# Patient Record
Sex: Female | Born: 2000 | Race: White | Hispanic: No | Marital: Single | Smoking: Never smoker
Health system: Southern US, Community
[De-identification: ages and names within clinical notes are randomized; demographics above are authoritative.]

## PROBLEM LIST (undated history)

## (undated) DIAGNOSIS — J302 Other seasonal allergic rhinitis: Secondary | ICD-10-CM

## (undated) DIAGNOSIS — F419 Anxiety disorder, unspecified: Secondary | ICD-10-CM

## (undated) DIAGNOSIS — F329 Major depressive disorder, single episode, unspecified: Secondary | ICD-10-CM

## (undated) DIAGNOSIS — F909 Attention-deficit hyperactivity disorder, unspecified type: Secondary | ICD-10-CM

## (undated) DIAGNOSIS — F32A Depression, unspecified: Secondary | ICD-10-CM

## (undated) DIAGNOSIS — J45909 Unspecified asthma, uncomplicated: Secondary | ICD-10-CM

## (undated) HISTORY — PX: WISDOM TOOTH EXTRACTION: SHX21

## (undated) HISTORY — DX: Other seasonal allergic rhinitis: J30.2

---
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
## 2022-03-09 DIAGNOSIS — Z20822 Contact with and (suspected) exposure to covid-19: Secondary | ICD-10-CM | POA: Diagnosis not present

## 2022-03-22 ENCOUNTER — Emergency Department
Admission: EM | Admit: 2022-03-22 | Discharge: 2022-03-22 | Disposition: A | Discharge status: Home / Self Care | Payer: Medicaid Other | Attending: Emergency Medicine | Admitting: Emergency Medicine

## 2022-03-22 ENCOUNTER — Emergency Department: Payer: Medicaid Other

## 2022-03-22 ENCOUNTER — Other Ambulatory Visit: Payer: Self-pay

## 2022-03-22 DIAGNOSIS — R000 Tachycardia, unspecified: Secondary | ICD-10-CM | POA: Diagnosis not present

## 2022-03-22 DIAGNOSIS — M549 Dorsalgia, unspecified: Secondary | ICD-10-CM | POA: Insufficient documentation

## 2022-03-22 DIAGNOSIS — R059 Cough, unspecified: Secondary | ICD-10-CM | POA: Diagnosis not present

## 2022-03-22 DIAGNOSIS — J101 Influenza due to other identified influenza virus with other respiratory manifestations: Secondary | ICD-10-CM | POA: Insufficient documentation

## 2022-03-22 DIAGNOSIS — Z1152 Encounter for screening for COVID-19: Secondary | ICD-10-CM | POA: Diagnosis not present

## 2022-03-22 DIAGNOSIS — R509 Fever, unspecified: Secondary | ICD-10-CM | POA: Diagnosis present

## 2022-03-22 LAB — URINALYSIS, ROUTINE W REFLEX MICROSCOPIC
Bilirubin Urine: NEGATIVE
Glucose, UA: NEGATIVE mg/dL
Hgb urine dipstick: NEGATIVE
Ketones, ur: 5 mg/dL — AB
Nitrite: NEGATIVE
Protein, ur: NEGATIVE mg/dL
Specific Gravity, Urine: 1.023 (ref 1.005–1.030)
pH: 6 (ref 5.0–8.0)

## 2022-03-22 LAB — RESP PANEL BY RT-PCR (RSV, FLU A&B, COVID)  RVPGX2
Influenza A by PCR: POSITIVE — AB
Influenza B by PCR: NEGATIVE
Resp Syncytial Virus by PCR: NEGATIVE
SARS Coronavirus 2 by RT PCR: NEGATIVE

## 2022-03-22 LAB — POC URINE PREG, ED: Preg Test, Ur: NEGATIVE

## 2022-03-22 LAB — GROUP A STREP BY PCR: Group A Strep by PCR: NOT DETECTED

## 2022-03-22 MED ORDER — BENZONATATE 100 MG PO CAPS: 100.0000 mg | ORAL_CAPSULE | Freq: Three times a day (TID) | ORAL | 0 refills | Status: AC | PRN

## 2022-03-22 MED ORDER — ACETAMINOPHEN 500 MG PO TABS
1000.0000 mg | ORAL_TABLET | Freq: Once | ORAL | Status: AC
  Administered 2022-03-22: 1000 mg via ORAL
  Filled 2022-03-22: qty 2

## 2022-03-22 MED ORDER — ALBUTEROL SULFATE (2.5 MG/3ML) 0.083% IN NEBU
2.5000 mg | INHALATION_SOLUTION | Freq: Once | RESPIRATORY_TRACT | Status: DC
Start: 2022-03-22 — End: 2022-03-22

## 2022-03-22 MED ORDER — ALBUTEROL SULFATE HFA 108 (90 BASE) MCG/ACT IN AERS: 2.0000 | INHALATION_SPRAY | Freq: Four times a day (QID) | RESPIRATORY_TRACT | 2 refills | Status: DC | PRN

## 2022-03-22 MED ORDER — OSELTAMIVIR PHOSPHATE 75 MG PO CAPS: 75.0000 mg | ORAL_CAPSULE | Freq: Two times a day (BID) | ORAL | 0 refills | Status: AC

## 2022-03-22 NOTE — ED Triage Notes (Signed)
Pt states waking up this morning with a cough and fever and sore throat. Pt states that when she started to cough her back started to hurt and it wont go away. Pt crying in triage.   Pt states taking tyzadine earlier today

## 2022-03-22 NOTE — ED Notes (Addendum)
Dr. Modesto Charon made aware of pt and gave verbal orders, please see orders

## 2022-03-22 NOTE — ED Provider Notes (Signed)
[facility] Provider Note  Patient Contact: 8:07 PM (approximate)   History   Back Pain   HPI  Connie Johnson is a 21 y.o. female presents to the emergency department with fever, back pain, pharyngitis and headache.  No chest pain, chest tightness or shortness of breath.  No vomiting or diarrhea.  No recent travel.      Physical Exam   Triage Vital Signs: ED Triage Vitals  Enc Vitals Group     BP 03/22/22 1813 123/88     Pulse Rate 03/22/22 1813 (!) 109     Resp 03/22/22 1813 (!) 26     Temp 03/22/22 1813 (!) 100.5 F (38.1 C)     Temp src --      SpO2 03/22/22 1813 99 %     Weight 03/22/22 1815 137 lb (62.1 kg)     Height 03/22/22 1815 5\' 3"  (1.6 m)     Head Circumference --      Peak Flow --      Pain Score 03/22/22 1815 10     Pain Loc --      Pain Edu? --      Excl. in GC? --     Most recent vital signs: Vitals:   03/22/22 1813 03/22/22 1828  BP: 123/88   Pulse: (!) 109   Resp: (!) 26 20  Temp: (!) 100.5 F (38.1 C)   SpO2: 99%     Constitutional: Alert and oriented. Patient is lying supine. Eyes: Conjunctivae are normal. PERRL. EOMI. Head: Atraumatic. ENT:      Ears: Tympanic membranes are mildly injected with mild effusion bilaterally.       Nose: No congestion/rhinnorhea.      Mouth/Throat: Mucous membranes are moist. Posterior pharynx is mildly erythematous.  Hematological/Lymphatic/Immunilogical: No cervical lymphadenopathy.  Cardiovascular: Normal rate, regular rhythm. Normal S1 and S2.  Good peripheral circulation. Respiratory: Normal respiratory effort without tachypnea or retractions. Lungs CTAB. Good air entry to the bases with no decreased or absent breath sounds. Gastrointestinal: Bowel sounds 4 quadrants. Soft and nontender to palpation. No guarding or rigidity. No palpable masses. No distention. No CVA tenderness. Musculoskeletal: Full range of motion to all extremities. No gross deformities  appreciated. Neurologic:  Normal speech and language. No gross focal neurologic deficits are appreciated.  Skin:  Skin is warm, dry and intact. No rash noted. Psychiatric: Mood and affect are normal. Speech and behavior are normal. Patient exhibits appropriate insight and judgement.    ED Results / Procedures / Treatments   Labs (all labs ordered are listed, but only abnormal results are displayed) Labs Reviewed  RESP PANEL BY RT-PCR (RSV, FLU A&B, COVID)  RVPGX2 - Abnormal; Notable for the following components:      Result Value   Influenza A by PCR POSITIVE (*)    All other components within normal limits  URINALYSIS, ROUTINE W REFLEX MICROSCOPIC - Abnormal; Notable for the following components:   Color, Urine YELLOW (*)    APPearance HAZY (*)    Ketones, ur 5 (*)    Leukocytes,Ua SMALL (*)    Bacteria, UA RARE (*)    All other components within normal limits  GROUP A STREP BY PCR  POC URINE PREG, ED       PROCEDURES:  Critical Care performed: No  Procedures   MEDICATIONS ORDERED IN ED: Medications  acetaminophen (TYLENOL) tablet 1,000 mg (1,000 mg Oral Given 03/22/22 1827)     IMPRESSION / MDM /  ASSESSMENT AND PLAN / ED COURSE  I reviewed the triage vital signs and the nursing notes.                             Assessment and plan Influenza A 21 year old female presents to the emergency department with flulike symptoms.  Patient had low-grade fever and was mildly tachycardic at triage.  Urine pregnancy test was negative.  Group A strep testing was negative.  Patient tested positive for influenza A.  Urinalysis not.  Medications were recommended at home.  All patient questions were answered.      FINAL CLINICAL IMPRESSION(S) / ED DIAGNOSES   Final diagnoses:  Influenza A     Rx / DC Orders   ED Discharge Orders          Ordered    oseltamivir (TAMIFLU) 75 MG capsule  2 times daily        03/22/22 1935    benzonatate (TESSALON PERLES) 100 MG  capsule  3 times daily PRN        03/22/22 1936    albuterol (VENTOLIN HFA) 108 (90 Base) MCG/ACT inhaler  Every 6 hours PRN        03/22/22 1939             Note:  This document was prepared using Dragon voice recognition software and may include unintentional dictation errors.   Pia Mau [place], PA-C 03/22/22 2010    Concha Se, MD 03/22/22 2122

## 2022-03-24 DIAGNOSIS — R509 Fever, unspecified: Secondary | ICD-10-CM | POA: Diagnosis not present

## 2022-03-24 DIAGNOSIS — J45909 Unspecified asthma, uncomplicated: Secondary | ICD-10-CM | POA: Diagnosis not present

## 2022-03-24 DIAGNOSIS — F909 Attention-deficit hyperactivity disorder, unspecified type: Secondary | ICD-10-CM | POA: Diagnosis not present

## 2022-03-24 DIAGNOSIS — R112 Nausea with vomiting, unspecified: Secondary | ICD-10-CM | POA: Diagnosis not present

## 2022-03-24 DIAGNOSIS — R21 Rash and other nonspecific skin eruption: Secondary | ICD-10-CM | POA: Diagnosis not present

## 2022-03-24 DIAGNOSIS — R0981 Nasal congestion: Secondary | ICD-10-CM | POA: Diagnosis not present

## 2022-03-25 ENCOUNTER — Telehealth: Payer: Self-pay | Admitting: *Deleted

## 2022-03-25 NOTE — Patient Outreach (Signed)
  Care Coordination [facility] Note Transition Care Management Unsuccessful Follow-up Telephone Call  Date of discharge and from where:  03/22/22 from [facility] and 03/24/22 from [facility] ED  Attempts:  1st Attempt  Reason for unsuccessful TCM follow-up call:  Left voice message   Estanislado Emms RN, BSN [place]  [facility] RN Care Coordinator

## 2022-04-22 ENCOUNTER — Ambulatory Visit
Admission: EM | Admit: 2022-04-22 | Discharge: 2022-04-22 | Disposition: A | Discharge status: Home / Self Care | Payer: Medicaid Other | Attending: Urgent Care | Admitting: Urgent Care

## 2022-04-22 DIAGNOSIS — N898 Other specified noninflammatory disorders of vagina: Secondary | ICD-10-CM | POA: Diagnosis not present

## 2022-04-22 LAB — POCT URINALYSIS DIP (MANUAL ENTRY)
Bilirubin, UA: NEGATIVE
Glucose, UA: NEGATIVE mg/dL
Ketones, POC UA: NEGATIVE mg/dL
Nitrite, UA: NEGATIVE
Spec Grav, UA: >=1.03 — AB (ref 1.010–1.025)
Urobilinogen, UA: 0.2 E.U./dL
pH, UA: 6 (ref 5.0–8.0)

## 2022-04-22 NOTE — ED Triage Notes (Signed)
Pt. Presents to UC requesting STD testing. States she sometimes experiences vaginal itching.

## 2022-04-22 NOTE — Discharge Instructions (Signed)
Follow up here or with your primary care provider if your symptoms are worsening or not improving.     

## 2022-04-22 NOTE — ED Provider Notes (Signed)
[name]    CSN: [id number] Arrival date & time: 04/22/22  1150      History   Chief Complaint Chief Complaint  Patient presents with   Vaginal Itching    HPI Connie Johnson is a 22 y.o. female.    Vaginal Itching    Patient presents to urgent care requesting STD testing.  She endorses intermittent vaginal itching.  She denies dysuria.  Denies abdominal pain or flank pain.  Denies vaginal discharge.  Denies fever, chills, body aches.  Past Medical History:  Diagnosis Date   ADHD (attention deficit hyperactivity disorder)    Anxiety    Asthma    Depression    Seasonal allergies     Patient Active Problem List   Diagnosis Date Noted   MDD (major depressive disorder), severe ([place]) 11/14/2017   Suicide attempt by hanging ([place]) 11/14/2017   Cannabis use disorder, mild, abuse 11/14/2017   DMDD (disruptive mood dysregulation disorder) ([place]) 05/31/2017   Severe recurrent major depression without psychotic features ([place]) 05/30/2017   Migraine without aura and without status migrainosus, not intractable 10/16/2015   Episodic tension-type headache, not intractable 10/16/2015   Intermittent explosive disorder 10/16/2015   Medication adverse effect 09/10/2015   Outbursts of anger 09/10/2015   Episodic memory loss 09/10/2015   Bipolar mood disorder ([place]) 05/21/2015   Attention deficit hyperactivity disorder (ADHD) 05/21/2015   GAD (generalized anxiety disorder) 05/21/2015   Adjustment disorder with other symptom 01/24/2014    History reviewed. No pertinent surgical history.  OB History     Gravida  0   Para  0   Term  0   Preterm  0   AB  0   Living         SAB  0   IAB  0   Ectopic  0   Multiple      Live Births               Home Medications    Prior to Admission medications   Medication Sig Start Date End Date Taking? Authorizing Provider  albuterol (VENTOLIN HFA) 108 (90 Base) MCG/ACT inhaler Inhale 2 puffs into the lungs  every 6 (six) hours as needed for wheezing or shortness of breath. 03/22/22   Lannie Fields, PA-C  atomoxetine (STRATTERA) 40 MG capsule Take 1 capsule (40 mg total) by mouth at bedtime. Patient not taking: Reported on 02/18/2020 11/19/17   Ambrose Finland, MD  brompheniramine-pseudoephedrine-DM 30-2-10 MG/5ML syrup Take 5 mLs by mouth 4 (four) times daily as needed. 08/15/21   Johnn Hai, PA-C  cloNIDine (CATAPRES) 0.1 MG tablet Take 0.1 mg by mouth at bedtime. Patient not taking: Reported on 12/13/2019 11/07/19   [provider]  diphenhydrAMINE (BENADRYL) 25 mg capsule Take 1 capsule (25 mg total) by mouth at bedtime as needed for itching. 11/18/17   Ambrose Finland, MD  FLUoxetine (PROZAC) 10 MG capsule Take 10 mg by mouth every evening. Patient not taking: Reported on 02/18/2020    [provider]  hydrOXYzine (ATARAX/VISTARIL) 25 MG tablet Take 25 mg by mouth 3 (three) times daily as needed. Patient not taking: Reported on 02/18/2020    [provider]  lamoTRIgine (LAMICTAL) 100 MG tablet Take 100 mg by mouth daily. Patient not taking: Reported on 02/18/2020 11/07/19   [provider]  metroNIDAZOLE (FLAGYL) 500 MG tablet Take 1 tablet (500 mg total) by mouth 2 (two) times daily. 12/18/21   Johnn Hai, PA-C  Family History Family History  Problem Relation Age of Onset   Bipolar disorder Mother    Depression Mother    ADD / ADHD Brother    Bipolar disorder Maternal Grandmother    Depression Maternal Grandmother     Social History Social History   Tobacco Use   Smoking status: Never   Smokeless tobacco: Never  Vaping Use   Vaping Use: Never used  Substance Use Topics   Alcohol use: No    Alcohol/week: 0.0 standard drinks of alcohol   Drug use: Not Currently    Types: Marijuana    Comment: says she is around it.     Allergies   Dexedrine [dextroamphetamine sulfate er]   Review of Systems Review of  Systems   Physical Exam Triage Vital Signs ED Triage Vitals  Enc Vitals Group     BP 04/22/22 1200 (!) 123/90     Pulse Rate 04/22/22 1200 (!) 123     Resp 04/22/22 1200 17     Temp 04/22/22 1200 98.1 F (36.7 C)     Temp src --      SpO2 04/22/22 1200 98 %     Weight --      Height --      Head Circumference --      Peak Flow --      Pain Score 04/22/22 1201 0     Pain Loc --      Pain Edu? --      Excl. in [place]? --    No data found.  Updated Vital Signs BP (!) 123/90   Pulse (!) 123   Temp 98.1 F (36.7 C)   Resp 17   LMP  (LMP Unknown)   SpO2 98%   Visual Acuity Right Eye Distance:   Left Eye Distance:   Bilateral Distance:    Right Eye Near:   Left Eye Near:    Bilateral Near:     Physical Exam Vitals reviewed.  Constitutional:      Appearance: Normal appearance.  Skin:    General: Skin is warm and dry.  Neurological:     General: No focal deficit present.     Mental Status: She is alert.  Psychiatric:        Mood and Affect: Mood normal.        Behavior: Behavior normal.      UC Treatments / Results  Labs (all labs ordered are listed, but only abnormal results are displayed) Labs Reviewed  POCT URINALYSIS DIP (MANUAL ENTRY)  CERVICOVAGINAL ANCILLARY ONLY    EKG   Radiology No results found.  Procedures Procedures (including critical care time)  Medications Ordered in UC Medications - No data to display  Initial Impression / Assessment and Plan / UC Course  I have reviewed the triage vital signs and the nursing notes.  Pertinent labs & imaging results that were available during my care of the patient were reviewed by me and considered in my medical decision making (see chart for details).   Vaginal swab was obtained and informed patient that she would be contacted if she needed treatment for any positive results.   Final Clinical Impressions(s) / UC Diagnoses   Final diagnoses:  Vaginal itching   Discharge Instructions    None    ED Prescriptions   None    PDMP not reviewed this encounter.   Rose Phi, [place] 04/22/22 1221

## 2022-04-23 LAB — CERVICOVAGINAL ANCILLARY ONLY
Bacterial Vaginitis (gardnerella): NEGATIVE
Candida Glabrata: NEGATIVE
Candida Vaginitis: POSITIVE — AB
Chlamydia: NEGATIVE
Comment: NEGATIVE
Comment: NEGATIVE
Comment: NEGATIVE
Comment: NEGATIVE
Comment: NEGATIVE
Comment: NORMAL
Neisseria Gonorrhea: NEGATIVE
Trichomonas: NEGATIVE

## 2022-04-24 ENCOUNTER — Telehealth (HOSPITAL_COMMUNITY): Payer: Self-pay | Admitting: Emergency Medicine

## 2022-04-24 MED ORDER — FLUCONAZOLE 150 MG PO TABS: 150.0000 mg | ORAL_TABLET | Freq: Once | ORAL | 0 refills | Status: AC

## 2022-05-17 ENCOUNTER — Ambulatory Visit
Admission: EM | Admit: 2022-05-17 | Discharge: 2022-05-17 | Disposition: A | Discharge status: Home / Self Care | Payer: Medicaid Other | Attending: Emergency Medicine | Admitting: Emergency Medicine

## 2022-05-17 DIAGNOSIS — J45901 Unspecified asthma with (acute) exacerbation: Secondary | ICD-10-CM | POA: Diagnosis not present

## 2022-05-17 DIAGNOSIS — J0100 Acute maxillary sinusitis, unspecified: Secondary | ICD-10-CM

## 2022-05-17 DIAGNOSIS — Z760 Encounter for issue of repeat prescription: Secondary | ICD-10-CM

## 2022-05-17 MED ORDER — VENTOLIN HFA 108 (90 BASE) MCG/ACT IN AERS: 1.0000 | INHALATION_SPRAY | Freq: Four times a day (QID) | RESPIRATORY_TRACT | 0 refills | Status: DC | PRN

## 2022-05-17 MED ORDER — PREDNISONE 10 MG PO TABS: 40.0000 mg | ORAL_TABLET | Freq: Every day | ORAL | 0 refills | Status: AC

## 2022-05-17 MED ORDER — AMOXICILLIN 875 MG PO TABS: 875.0000 mg | ORAL_TABLET | Freq: Two times a day (BID) | ORAL | 0 refills | Status: AC

## 2022-05-17 NOTE — Discharge Instructions (Addendum)
Use the albuterol inhaler as directed.  Take the prednisone and amoxicillin as directed.  Follow up with your primary care provider.

## 2022-05-17 NOTE — ED Provider Notes (Signed)
[facility]    CSN: [id number] Arrival date & time: 05/17/22  1413      History   Chief Complaint Chief Complaint  Patient presents with   Nasal Congestion    HPI Connie Johnson is a 22 y.o. female.  Patient presents with 1 week history of congestion, runny nose, postnasal drip, cough, wheezing at night, sore throat in the mornings.  Treating with albuterol inhaler but needs a refill.  Also treating with Nyquil.  She denies fever, chills, shortness of breath, vomiting, diarrhea, or other symptoms.  Her medical history includes asthma.    The history is provided by the patient and medical records.    Past Medical History:  Diagnosis Date   ADHD (attention deficit hyperactivity disorder)    Anxiety    Asthma    Depression    Seasonal allergies     Patient Active Problem List   Diagnosis Date Noted   MDD (major depressive disorder), severe ([place]) 11/14/2017   Suicide attempt by hanging ([place]) 11/14/2017   Cannabis use disorder, mild, abuse 11/14/2017   DMDD (disruptive mood dysregulation disorder) ([place]) 05/31/2017   Severe recurrent major depression without psychotic features ([place]) 05/30/2017   Migraine without aura and without status migrainosus, not intractable 10/16/2015   Episodic tension-type headache, not intractable 10/16/2015   Intermittent explosive disorder 10/16/2015   Medication adverse effect 09/10/2015   Outbursts of anger 09/10/2015   Episodic memory loss 09/10/2015   Bipolar mood disorder ([place]) 05/21/2015   Attention deficit hyperactivity disorder (ADHD) 05/21/2015   GAD (generalized anxiety disorder) 05/21/2015   Adjustment disorder with other symptom 01/24/2014    Past Surgical History:  Procedure Laterality Date   WISDOM TOOTH EXTRACTION      OB History     Gravida  0   Para  0   Term  0   Preterm  0   AB  0   Living         SAB  0   IAB  0   Ectopic  0   Multiple      Live Births               Home  Medications    Prior to Admission medications   Medication Sig Start Date End Date Taking? Authorizing Provider  albuterol (VENTOLIN HFA) 108 (90 Base) MCG/ACT inhaler Inhale 1-2 puffs into the lungs every 6 (six) hours as needed for wheezing or shortness of breath. 05/17/22  Yes Sharion Balloon, NP  amoxicillin (AMOXIL) 875 MG tablet Take 1 tablet (875 mg total) by mouth 2 (two) times daily for 7 days. 05/17/22 05/24/22 Yes Sharion Balloon, NP  atomoxetine (STRATTERA) 40 MG capsule Take 1 capsule (40 mg total) by mouth at bedtime. Patient not taking: Reported on 02/18/2020 11/19/17   Ambrose Finland, MD  brompheniramine-pseudoephedrine-DM 30-2-10 MG/5ML syrup Take 5 mLs by mouth 4 (four) times daily as needed. Patient not taking: Reported on 05/17/2022 08/15/21   Johnn Hai, PA-C  cloNIDine (CATAPRES) 0.1 MG tablet Take 0.1 mg by mouth at bedtime. Patient not taking: Reported on 12/13/2019 11/07/19   [provider]  diphenhydrAMINE (BENADRYL) 25 mg capsule Take 1 capsule (25 mg total) by mouth at bedtime as needed for itching. Patient not taking: Reported on 05/17/2022 11/18/17   Ambrose Finland, MD  FLUoxetine (PROZAC) 10 MG capsule Take 10 mg by mouth every evening. Patient not taking: Reported on 02/18/2020    [provider]  hydrOXYzine (ATARAX/VISTARIL) 25 MG tablet Take 25 mg by mouth 3 (three) times daily as needed. Patient not taking: Reported on 02/18/2020    [provider]  lamoTRIgine (LAMICTAL) 100 MG tablet Take 100 mg by mouth daily. Patient not taking: Reported on 02/18/2020 11/07/19   [provider]  metroNIDAZOLE (FLAGYL) 500 MG tablet Take 1 tablet (500 mg total) by mouth 2 (two) times daily. Patient not taking: Reported on 05/17/2022 12/18/21   Johnn Hai, PA-C  predniSONE (DELTASONE) 10 MG tablet Take 4 tablets (40 mg total) by mouth daily for 5 days. 05/17/22 05/22/22 Yes Sharion Balloon, NP    Family History Family  History  Problem Relation Age of Onset   Bipolar disorder Mother    Depression Mother    ADD / ADHD Brother    Bipolar disorder Maternal Grandmother    Depression Maternal Grandmother     Social History Social History   Tobacco Use   Smoking status: Never   Smokeless tobacco: Never  Vaping Use   Vaping Use: Never used  Substance Use Topics   Alcohol use: No    Alcohol/week: 0.0 standard drinks of alcohol   Drug use: Not Currently    Types: Marijuana    Comment: says she is around it.     Allergies   Dexedrine [dextroamphetamine sulfate er]   Review of Systems Review of Systems  Constitutional:  Negative for chills and fever.  HENT:  Positive for congestion, postnasal drip, rhinorrhea and sore throat. Negative for ear pain.   Respiratory:  Positive for cough and wheezing. Negative for shortness of breath.   Cardiovascular:  Negative for chest pain and palpitations.  Gastrointestinal:  Negative for diarrhea and vomiting.  Skin:  Negative for color change and rash.  All other systems reviewed and are negative.    Physical Exam Triage Vital Signs ED Triage Vitals  Enc Vitals Group     BP 05/17/22 1542 114/75     Pulse Rate 05/17/22 1542 62     Resp 05/17/22 1542 18     Temp 05/17/22 1542 98.5 F (36.9 C)     Temp src --      SpO2 05/17/22 1542 98 %     Weight --      Height --      Head Circumference --      Peak Flow --      Pain Score 05/17/22 1545 0     Pain Loc --      Pain Edu? --      Excl. in [place]? --    No data found.  Updated Vital Signs BP 114/75   Pulse 62   Temp 98.5 F (36.9 C)   Resp 18   LMP 05/10/2022   SpO2 98%   Visual Acuity Right Eye Distance:   Left Eye Distance:   Bilateral Distance:    Right Eye Near:   Left Eye Near:    Bilateral Near:     Physical Exam Vitals and nursing note reviewed.  Constitutional:      General: She is not in acute distress.    Appearance: Normal appearance. She is well-developed. She is not  ill-appearing.  HENT:     Right Ear: Tympanic membrane normal.     Left Ear: Tympanic membrane normal.     Nose: Congestion and rhinorrhea present.     Mouth/Throat:     Mouth: Mucous membranes are moist.     Pharynx: Oropharynx is  clear.  Cardiovascular:     Rate and Rhythm: Normal rate and regular rhythm.     Heart sounds: Normal heart sounds.  Pulmonary:     Effort: Pulmonary effort is normal. No respiratory distress.     Breath sounds: Normal breath sounds. No wheezing.  Musculoskeletal:     Cervical back: Neck supple.  Skin:    General: Skin is warm and dry.  Neurological:     Mental Status: She is alert.  Psychiatric:        Mood and Affect: Mood normal.        Behavior: Behavior normal.      UC Treatments / Results  Labs (all labs ordered are listed, but only abnormal results are displayed) Labs Reviewed - No data to display  EKG   Radiology No results found.  Procedures Procedures (including critical care time)  Medications Ordered in UC Medications - No data to display  Initial Impression / Assessment and Plan / UC Course  I have reviewed the triage vital signs and the nursing notes.  Pertinent labs & imaging results that were available during my care of the patient were reviewed by me and considered in my medical decision making (see chart for details).    Asthma exacerbation, acute sinusitis, medication refill (albuterol inhaler).   Treating today with refill of albuterol inhaler, prednisone, and amoxicillin.  Education provided on asthma and sinusitis.  Instructed patient to follow up with her PCP.  She agrees to plan of care.    Final Clinical Impressions(s) / UC Diagnoses   Final diagnoses:  Asthma with acute exacerbation, unspecified asthma severity, unspecified whether persistent  Acute non-recurrent maxillary sinusitis  Encounter for medication refill     Discharge Instructions      Use the albuterol inhaler as directed.  Take the  prednisone and amoxicillin as directed.  Follow up with your primary care provider.        ED Prescriptions     Medication Sig Dispense Auth. Provider   albuterol (VENTOLIN HFA) 108 (90 Base) MCG/ACT inhaler Inhale 1-2 puffs into the lungs every 6 (six) hours as needed for wheezing or shortness of breath. 54 g Sharion Balloon, NP   predniSONE (DELTASONE) 10 MG tablet Take 4 tablets (40 mg total) by mouth daily for 5 days. 20 tablet Sharion Balloon, NP   amoxicillin (AMOXIL) 875 MG tablet Take 1 tablet (875 mg total) by mouth 2 (two) times daily for 7 days. 14 tablet Sharion Balloon, NP      PDMP not reviewed this encounter.   Sharion Balloon, NP 05/17/22 1626

## 2022-05-17 NOTE — ED Triage Notes (Addendum)
Patient to Urgent Care with complaints of URI symptoms- nasal congestion w/ discolored drainage/ cough/ sore throat at night. Denies any known fevers.   Symptoms started one week ago. Taking nyquil.   Using albuterol inhaler- requests a refill.

## 2022-09-16 ENCOUNTER — Ambulatory Visit
Admission: EM | Admit: 2022-09-16 | Discharge: 2022-09-16 | Disposition: A | Discharge status: Home / Self Care | Payer: Medicaid Other | Attending: Emergency Medicine | Admitting: Emergency Medicine

## 2022-09-16 DIAGNOSIS — N898 Other specified noninflammatory disorders of vagina: Secondary | ICD-10-CM | POA: Insufficient documentation

## 2022-09-16 DIAGNOSIS — Z113 Encounter for screening for infections with a predominantly sexual mode of transmission: Secondary | ICD-10-CM | POA: Insufficient documentation

## 2022-09-16 DIAGNOSIS — Z760 Encounter for issue of repeat prescription: Secondary | ICD-10-CM | POA: Diagnosis not present

## 2022-09-16 LAB — POCT URINE PREGNANCY: Preg Test, Ur: NEGATIVE

## 2022-09-16 MED ORDER — VENTOLIN HFA 108 (90 BASE) MCG/ACT IN AERS: 1.0000 | INHALATION_SPRAY | RESPIRATORY_TRACT | 0 refills | Status: DC | PRN

## 2022-09-16 NOTE — ED Triage Notes (Addendum)
Patient to Urgent Care with complaints of malodorous vaginal discharge. Unsure of exactly how long her symptoms have been occurring. Denies any urinary symptoms.   Denies any known exposure to STD's but does request STD testing.   Also requests albuterol inhaler be refilled.

## 2022-09-16 NOTE — Discharge Instructions (Addendum)
Your vaginal tests are pending.  If your test results are positive, we will call you.  You and your sexual partner(s) may require treatment at that time.  Do not have sexual activity for at least 7 days.    A refill of your albuterol inhaler was sent to your pharmacy.  Follow up with your primary care provider for additional refills.

## 2022-09-16 NOTE — ED Provider Notes (Signed)
Connie Johnson    CSN: [id number] Arrival date & time: 09/16/22  0957      History   Chief Complaint Chief Complaint  Patient presents with   SEXUALLY TRANSMITTED DISEASE    HPI Connie Johnson is a 22 y.o. female.  Patient presents with vaginal odor x 1 week.  She requests STD testing.  She is sexually active.  She denies vaginal discharge, fever, rash, abdominal pain, dysuria, hematuria, pelvic pain, flank pain, or other symptoms.  Patient also presents with request for her albuterol inhaler to be refilled.  She reports history of asthma and allergies.  She denies cough, wheezing, shortness of breath.  She recently moved and lost her inhaler.    The history is provided by the patient and medical records.    Past Medical History:  Diagnosis Date   ADHD (attention deficit hyperactivity disorder)    Anxiety    Asthma    Depression    Seasonal allergies     Patient Active Problem List   Diagnosis Date Noted   MDD (major depressive disorder), severe ([facility]) 11/14/2017   Suicide attempt by hanging ([facility]) 11/14/2017   Cannabis use disorder, mild, abuse 11/14/2017   DMDD (disruptive mood dysregulation disorder) ([facility]) 05/31/2017   Severe recurrent major depression without psychotic features ([facility]) 05/30/2017   Migraine without aura and without status migrainosus, not intractable 10/16/2015   Episodic tension-type headache, not intractable 10/16/2015   Intermittent explosive disorder 10/16/2015   Medication adverse effect 09/10/2015   Outbursts of anger 09/10/2015   Episodic memory loss 09/10/2015   Bipolar mood disorder ([facility]) 05/21/2015   Attention deficit hyperactivity disorder (ADHD) 05/21/2015   GAD (generalized anxiety disorder) 05/21/2015   Adjustment disorder with other symptom 01/24/2014    Past Surgical History:  Procedure Laterality Date   WISDOM TOOTH EXTRACTION      OB History     Gravida  0   Para  0   Term  0   Preterm  0   AB  0   Living          SAB  0   IAB  0   Ectopic  0   Multiple      Live Births               Home Medications    Prior to Admission medications   Medication Sig Start Date End Date Taking? Authorizing Provider  albuterol (VENTOLIN HFA) 108 (90 Base) MCG/ACT inhaler Inhale 1-2 puffs into the lungs every 4 (four) hours as needed for wheezing or shortness of breath. 09/16/22  Yes Mickie Bail, NP  atomoxetine (STRATTERA) 40 MG capsule Take 1 capsule (40 mg total) by mouth at bedtime. Patient not taking: Reported on 02/18/2020 11/19/17   Leata Mouse, MD  brompheniramine-pseudoephedrine-DM 30-2-10 MG/5ML syrup Take 5 mLs by mouth 4 (four) times daily as needed. Patient not taking: Reported on 05/17/2022 08/15/21   [name], PA-C  cloNIDine (CATAPRES) 0.1 MG tablet Take 0.1 mg by mouth at bedtime. Patient not taking: Reported on 12/13/2019 11/07/19   [provider]  diphenhydrAMINE (BENADRYL) 25 mg capsule Take 1 capsule (25 mg total) by mouth at bedtime as needed for itching. Patient not taking: Reported on 05/17/2022 11/18/17   Leata Mouse, MD  FLUoxetine (PROZAC) 10 MG capsule Take 10 mg by mouth every evening. Patient not taking: Reported on 02/18/2020    [provider]  hydrOXYzine (ATARAX/VISTARIL) 25 MG tablet Take 25 mg  by mouth 3 (three) times daily as needed. Patient not taking: Reported on 02/18/2020    [provider]  lamoTRIgine (LAMICTAL) 100 MG tablet Take 100 mg by mouth daily. Patient not taking: Reported on 02/18/2020 11/07/19   [provider]  metroNIDAZOLE (FLAGYL) 500 MG tablet Take 1 tablet (500 mg total) by mouth 2 (two) times daily. Patient not taking: Reported on 05/17/2022 12/18/21   [name], PA-C    Family History Family History  Problem Relation Age of Onset   Bipolar disorder Mother    Depression Mother    ADD / ADHD Brother    Bipolar disorder Maternal Grandmother    Depression Maternal  Grandmother     Social History Social History   Tobacco Use   Smoking status: Never   Smokeless tobacco: Never  Vaping Use   Vaping Use: Never used  Substance Use Topics   Alcohol use: No    Alcohol/week: 0.0 standard drinks of alcohol   Drug use: Not Currently    Types: Marijuana    Comment: says she is around it.     Allergies   Dexedrine [dextroamphetamine sulfate er]   Review of Systems Review of Systems  Constitutional:  Negative for chills and fever.  Respiratory:  Negative for cough and shortness of breath.   Gastrointestinal:  Negative for abdominal pain, constipation, diarrhea, nausea and vomiting.  Genitourinary:  Negative for dysuria, flank pain, frequency, hematuria, pelvic pain and vaginal discharge.  Skin:  Negative for rash.     Physical Exam Triage Vital Signs ED Triage Vitals  Enc Vitals Group     BP      Pulse      Resp      Temp      Temp src      SpO2      Weight      Height      Head Circumference      Peak Flow      Pain Score      Pain Loc      Pain Edu?      Excl. in [place]?    No data found.  Updated Vital Signs BP 110/75   Pulse 82   Temp 98.3 F (36.8 C)   Resp 18   SpO2 98%   Visual Acuity Right Eye Distance:   Left Eye Distance:   Bilateral Distance:    Right Eye Near:   Left Eye Near:    Bilateral Near:     Physical Exam Vitals and nursing note reviewed.  Constitutional:      General: She is not in acute distress.    Appearance: Normal appearance. She is well-developed. She is not ill-appearing.  HENT:     Right Ear: Tympanic membrane normal.     Left Ear: Tympanic membrane normal.     Nose: Nose normal.     Mouth/Throat:     Mouth: Mucous membranes are moist.     Pharynx: Oropharynx is clear.  Cardiovascular:     Rate and Rhythm: Normal rate and regular rhythm.     Heart sounds: Normal heart sounds.  Pulmonary:     Effort: Pulmonary effort is normal. No respiratory distress.     Breath sounds: Normal  breath sounds. No wheezing.  Abdominal:     General: Bowel sounds are normal.     Palpations: Abdomen is soft.     Tenderness: There is no abdominal tenderness. There is no right CVA tenderness,  left CVA tenderness, guarding or rebound.  Musculoskeletal:     Cervical back: Neck supple.  Skin:    General: Skin is warm and dry.  Neurological:     Mental Status: She is alert.  Psychiatric:        Mood and Affect: Mood normal.        Behavior: Behavior normal.      UC Treatments / Results  Labs (all labs ordered are listed, but only abnormal results are displayed) Labs Reviewed  POCT URINE PREGNANCY  CERVICOVAGINAL ANCILLARY ONLY    EKG   Radiology No results found.  Procedures Procedures (including critical care time)  Medications Ordered in UC Medications - No data to display  Initial Impression / Assessment and Plan / UC Course  I have reviewed the triage vital signs and the nursing notes.  Pertinent labs & imaging results that were available during my care of the patient were reviewed by me and considered in my medical decision making (see chart for details).    STD screening, vaginal odor, encounter for medication refill.  Patient denies symptoms other than vaginal odor.  She is concerned for STDs and requests testing.  She obtained vaginal self swab for testing.  Discussed that we will call if test results are positive.  Discussed that she may require treatment at that time.  Discussed that sexual partner(s) may also require treatment.  Instructed patient to abstain from sexual activity for at least 7 days.  Instructed her to follow-up with her PCP or gynecologist if her symptoms are not improving.  Patient also requests a refill on her albuterol inhaler which is sent to her pharmacy today.  She lost her inhaler in a recent move.  Instructed her to follow-up with her PCP for additional refills.  Patient agrees to plan of care.   Final Clinical Impressions(s) / UC  Diagnoses   Final diagnoses:  Screening for STD (sexually transmitted disease)  Vaginal odor  Encounter for medication refill     Discharge Instructions      Your vaginal tests are pending.  If your test results are positive, we will call you.  You and your sexual partner(s) may require treatment at that time.  Do not have sexual activity for at least 7 days.    A refill of your albuterol inhaler was sent to your pharmacy.  Follow up with your primary care provider for additional refills.          ED Prescriptions     Medication Sig Dispense Auth. Provider   albuterol (VENTOLIN HFA) 108 (90 Base) MCG/ACT inhaler Inhale 1-2 puffs into the lungs every 4 (four) hours as needed for wheezing or shortness of breath. 54 g Mickie Bail, NP      PDMP not reviewed this encounter.   Mickie Bail, NP 09/16/22 1024

## 2022-09-17 ENCOUNTER — Telehealth (HOSPITAL_COMMUNITY): Payer: Self-pay | Admitting: Emergency Medicine

## 2022-09-17 LAB — CERVICOVAGINAL ANCILLARY ONLY
Bacterial Vaginitis (gardnerella): POSITIVE — AB
Candida Glabrata: NEGATIVE
Candida Vaginitis: NEGATIVE
Chlamydia: NEGATIVE
Comment: NEGATIVE
Comment: NEGATIVE
Comment: NEGATIVE
Comment: NEGATIVE
Comment: NEGATIVE
Comment: NORMAL
Neisseria Gonorrhea: NEGATIVE
Trichomonas: NEGATIVE

## 2022-09-17 MED ORDER — METRONIDAZOLE 500 MG PO TABS: 500.0000 mg | ORAL_TABLET | Freq: Two times a day (BID) | ORAL | 0 refills | Status: DC

## 2022-11-02 ENCOUNTER — Ambulatory Visit
Admission: EM | Admit: 2022-11-02 | Discharge: 2022-11-02 | Disposition: A | Discharge status: Home / Self Care | Payer: Medicaid Other | Attending: Family Medicine | Admitting: Family Medicine

## 2022-11-02 DIAGNOSIS — Z1152 Encounter for screening for COVID-19: Secondary | ICD-10-CM | POA: Diagnosis not present

## 2022-11-02 DIAGNOSIS — Z113 Encounter for screening for infections with a predominantly sexual mode of transmission: Secondary | ICD-10-CM

## 2022-11-02 DIAGNOSIS — N760 Acute vaginitis: Secondary | ICD-10-CM

## 2022-11-02 LAB — POCT URINE PREGNANCY: Preg Test, Ur: NEGATIVE

## 2022-11-02 LAB — POCT RAPID STREP A (OFFICE): Rapid Strep A Screen: NEGATIVE

## 2022-11-02 LAB — SARS CORONAVIRUS 2 (TAT 6-24 HRS): SARS Coronavirus 2: NEGATIVE

## 2022-11-02 MED ORDER — METRONIDAZOLE 500 MG PO TABS
500.0000 mg | ORAL_TABLET | Freq: Two times a day (BID) | ORAL | 0 refills | Status: DC
Start: 2022-11-02 — End: 2023-01-18

## 2022-11-02 MED ORDER — FLUCONAZOLE 150 MG PO TABS: 150.0000 mg | ORAL_TABLET | ORAL | 0 refills | Status: DC | PRN

## 2022-11-02 MED ORDER — ACETAMINOPHEN 325 MG PO TABS
650.0000 mg | ORAL_TABLET | Freq: Once | ORAL | Status: AC
  Administered 2022-11-02: 650 mg via ORAL

## 2022-11-02 NOTE — ED Triage Notes (Signed)
Patient to Urgent Care with complaints of sore throat/ productive cough/ generalized body aches/ chills. Possible fever this morning. Symptoms started yesterday.   Also requests STD testing. Denies known exposure. Reports odor/ vaginal discharge. Concern about BV.

## 2022-11-02 NOTE — ED Provider Notes (Signed)
[name]    CSN: [id number] Arrival date & time: 11/02/22  0946      History   Chief Complaint Chief Complaint  Patient presents with   Generalized Body Aches   SEXUALLY TRANSMITTED DISEASE    HPI Connie Johnson is a 22 y.o. female.   HPI Patient here today with complaints of fever,sore throat, cough, and generalized body aches x 1 day. Febrile on arrival today.  No known sick contacts. She has not taken may medication for symptoms.   STD Testing Concern for possible BV. Endorses vaginal odor and discharge. History of BV and Yeast. No known STD exposure. No LMP recorded. Patient has had an implant.   Past Medical History:  Diagnosis Date   ADHD (attention deficit hyperactivity disorder)    Anxiety    Asthma    Depression    Seasonal allergies     Patient Active Problem List   Diagnosis Date Noted   MDD (major depressive disorder), severe (HCC) 11/14/2017   Suicide attempt by hanging (HCC) 11/14/2017   Cannabis use disorder, mild, abuse 11/14/2017   DMDD (disruptive mood dysregulation disorder) (HCC) 05/31/2017   Severe recurrent major depression without psychotic features (HCC) 05/30/2017   Migraine without aura and without status migrainosus, not intractable 10/16/2015   Episodic tension-type headache, not intractable 10/16/2015   Intermittent explosive disorder 10/16/2015   Medication adverse effect 09/10/2015   Outbursts of anger 09/10/2015   Episodic memory loss 09/10/2015   Bipolar mood disorder (HCC) 05/21/2015   Attention deficit hyperactivity disorder (ADHD) 05/21/2015   GAD (generalized anxiety disorder) 05/21/2015   Adjustment disorder with other symptom 01/24/2014    Past Surgical History:  Procedure Laterality Date   WISDOM TOOTH EXTRACTION      OB History     Gravida  0   Para  0   Term  0   Preterm  0   AB  0   Living         SAB  0   IAB  0   Ectopic  0   Multiple      Live Births                Home Medications    Prior to Admission medications   Medication Sig Start Date End Date Taking? Authorizing Provider  fluconazole (DIFLUCAN) 150 MG tablet Take 1 tablet (150 mg total) by mouth every three (3) days as needed. Repeat if needed 11/02/22  Yes Bing Neighbors, NP  metroNIDAZOLE (FLAGYL) 500 MG tablet Take 1 tablet (500 mg total) by mouth 2 (two) times daily with a meal. DO NOT CONSUME ALCOHOL WHILE TAKING THIS MEDICATION. 11/02/22  Yes Bing Neighbors, NP  albuterol (VENTOLIN HFA) 108 (90 Base) MCG/ACT inhaler Inhale 1-2 puffs into the lungs every 4 (four) hours as needed for wheezing or shortness of breath. 09/16/22   Mickie Bail, NP  atomoxetine (STRATTERA) 40 MG capsule Take 1 capsule (40 mg total) by mouth at bedtime. Patient not taking: Reported on 02/18/2020 11/19/17   Leata Mouse, MD  brompheniramine-pseudoephedrine-DM 30-2-10 MG/5ML syrup Take 5 mLs by mouth 4 (four) times daily as needed. Patient not taking: Reported on 05/17/2022 08/15/21   [name], PA-C  cloNIDine (CATAPRES) 0.1 MG tablet Take 0.1 mg by mouth at bedtime. Patient not taking: Reported on 12/13/2019 11/07/19   [provider]  diphenhydrAMINE (BENADRYL) 25 mg capsule Take 1 capsule (25 mg total) by mouth at bedtime as  needed for itching. Patient not taking: Reported on 05/17/2022 11/18/17   Leata Mouse, MD  FLUoxetine (PROZAC) 10 MG capsule Take 10 mg by mouth every evening. Patient not taking: Reported on 02/18/2020    [provider]  hydrOXYzine (ATARAX/VISTARIL) 25 MG tablet Take 25 mg by mouth 3 (three) times daily as needed. Patient not taking: Reported on 02/18/2020    [provider]  lamoTRIgine (LAMICTAL) 100 MG tablet Take 100 mg by mouth daily. Patient not taking: Reported on 02/18/2020 11/07/19   [provider]    Family History Family History  Problem Relation Age of Onset   Bipolar disorder Mother    Depression  Mother    ADD / ADHD Brother    Bipolar disorder Maternal Grandmother    Depression Maternal Grandmother     Social History Social History   Tobacco Use   Smoking status: Never   Smokeless tobacco: Never  Vaping Use   Vaping status: Never Used  Substance Use Topics   Alcohol use: No    Alcohol/week: 0.0 standard drinks of alcohol   Drug use: Not Currently    Types: Marijuana    Comment: says she is around it.     Allergies   Dexedrine [dextroamphetamine sulfate er]   Review of Systems Review of Systems Pertinent negatives listed in HPI   Physical Exam Triage Vital Signs ED Triage Vitals [11/02/22 1002]  Encounter Vitals Group     BP 129/83     Systolic BP Percentile      Diastolic BP Percentile      Pulse Rate (!) 113     Resp 18     Temp 100.3 F (37.9 C)     Temp src      SpO2 96 %     Weight      Height      Head Circumference      Peak Flow      Pain Score      Pain Loc      Pain Education      Exclude from Growth Chart    No data found.  Updated Vital Signs BP 129/83   Pulse (!) 113   Temp 100.3 F (37.9 C)   Resp 18   SpO2 96%   Visual Acuity Right Eye Distance:   Left Eye Distance:   Bilateral Distance:    Right Eye Near:   Left Eye Near:    Bilateral Near:     Physical Exam Vitals and nursing note reviewed.  Constitutional:      Appearance: She is ill-appearing.  HENT:     Head: Normocephalic and atraumatic.     Nose: Congestion present.     Mouth/Throat:     Pharynx: Posterior oropharyngeal erythema present. No oropharyngeal exudate.  Eyes:     Extraocular Movements: Extraocular movements intact.     Conjunctiva/sclera: Conjunctivae normal.     Pupils: Pupils are equal, round, and reactive to light.  Cardiovascular:     Rate and Rhythm: Regular rhythm. Tachycardia present.  Pulmonary:     Effort: Pulmonary effort is normal.     Breath sounds: Normal breath sounds.  Skin:    General: Skin is warm and dry.   Neurological:     General: No focal deficit present.     Mental Status: She is alert.      UC Treatments / Results  Labs (all labs ordered are listed, but only abnormal results are displayed) Labs Reviewed  SARS CORONAVIRUS 2 (TAT 6-24 HRS)  POCT RAPID STREP A (OFFICE)  POCT URINE PREGNANCY  CERVICOVAGINAL ANCILLARY ONLY    EKG   Radiology No results found.  Procedures Procedures (including critical care time)  Medications Ordered in UC Medications  acetaminophen (TYLENOL) tablet 650 mg (has no administration in time range)    Initial Impression / Assessment and Plan / UC Course  I have reviewed the triage vital signs and the nursing notes.  Pertinent labs & imaging results that were available during my care of the patient were reviewed by me and considered in my medical decision making (see chart for details).    Rapid Strep negative. COVID test pending. Symptom management warranted. Treating for vaginitis while awaiting vaginal cytology, metronidazole and Diflucan.  Urine hCG negative patient encouraged to continue Tylenol and ibuprofen as needed for fever and bodyaches.  Work note provided.  Precautions given. Final Clinical Impressions(s) / UC Diagnoses   Final diagnoses:  Screen for STD (sexually transmitted disease)  Vaginitis and vulvovaginitis  Encounter for screening for COVID-19     Discharge Instructions      Your lab studies should be available within 24 no later than 48 hours.  Our office will call you if any additional treatment is needed.  Resume use of your albuterol inhaler.  Return as needed or  if symptoms worsen.     ED Prescriptions     Medication Sig Dispense Auth. Provider   metroNIDAZOLE (FLAGYL) 500 MG tablet Take 1 tablet (500 mg total) by mouth 2 (two) times daily with a meal. DO NOT CONSUME ALCOHOL WHILE TAKING THIS MEDICATION. 14 tablet Bing Neighbors, NP   fluconazole (DIFLUCAN) 150 MG tablet Take 1 tablet (150 mg total)  by mouth every three (3) days as needed. Repeat if needed 2 tablet Bing Neighbors, NP      PDMP not reviewed this encounter.   Bing Neighbors, NP 11/02/22 1027

## 2022-11-02 NOTE — Discharge Instructions (Addendum)
Your lab studies should be available within 24 no later than 48 hours.  Our office will call you if any additional treatment is needed.  Resume use of your albuterol inhaler.  Return as needed or  if symptoms worsen.

## 2022-12-07 DIAGNOSIS — Z8619 Personal history of other infectious and parasitic diseases: Secondary | ICD-10-CM | POA: Diagnosis not present

## 2022-12-07 DIAGNOSIS — Z113 Encounter for screening for infections with a predominantly sexual mode of transmission: Secondary | ICD-10-CM | POA: Diagnosis not present

## 2022-12-07 DIAGNOSIS — B9689 Other specified bacterial agents as the cause of diseases classified elsewhere: Secondary | ICD-10-CM | POA: Diagnosis not present

## 2022-12-07 DIAGNOSIS — N907 Vulvar cyst: Secondary | ICD-10-CM | POA: Diagnosis not present

## 2022-12-07 DIAGNOSIS — N760 Acute vaginitis: Secondary | ICD-10-CM | POA: Diagnosis not present

## 2023-01-18 ENCOUNTER — Ambulatory Visit
Admission: EM | Admit: 2023-01-18 | Discharge: 2023-01-18 | Disposition: A | Discharge status: Home / Self Care | Payer: Medicaid Other | Attending: Emergency Medicine | Admitting: Emergency Medicine

## 2023-01-18 DIAGNOSIS — R059 Cough, unspecified: Secondary | ICD-10-CM | POA: Insufficient documentation

## 2023-01-18 DIAGNOSIS — B9789 Other viral agents as the cause of diseases classified elsewhere: Secondary | ICD-10-CM | POA: Insufficient documentation

## 2023-01-18 DIAGNOSIS — Z1152 Encounter for screening for COVID-19: Secondary | ICD-10-CM | POA: Insufficient documentation

## 2023-01-18 DIAGNOSIS — J069 Acute upper respiratory infection, unspecified: Secondary | ICD-10-CM | POA: Insufficient documentation

## 2023-01-18 DIAGNOSIS — N898 Other specified noninflammatory disorders of vagina: Secondary | ICD-10-CM | POA: Insufficient documentation

## 2023-01-18 MED ORDER — NYSTATIN 100000 UNIT/ML MT SUSP: 5.0000 mL | Freq: Four times a day (QID) | OROMUCOSAL | 0 refills | Status: DC | PRN

## 2023-01-18 MED ORDER — IPRATROPIUM BROMIDE 0.03 % NA SOLN: 2.0000 | Freq: Two times a day (BID) | NASAL | 12 refills | Status: DC

## 2023-01-18 MED ORDER — METRONIDAZOLE 500 MG PO TABS: 500.0000 mg | ORAL_TABLET | Freq: Two times a day (BID) | ORAL | 0 refills | Status: DC

## 2023-01-18 NOTE — ED Provider Notes (Signed)
[facility] Cower    CSN: [id number] Arrival date & time: 01/18/23  1120      History   Chief Complaint Chief Complaint  Patient presents with   URI    HPI Connie Johnson is a 22 y.o. female.    Patient presents for evaluation of fever, intermittent generalized headaches, nasal congestion, rhinorrhea, intermittent sinus pain and pressure beginning 2 days ago.  Possible sick contacts.  Tolerating food and liquids.  Has not attempted treatment.  Patient presents for evaluation of persistent vaginal discharge present for 1 week, described as Deacon Gadbois.  Endorses that she was diagnosed with BV within the last month but did not take medicine as she went out of town and was unable to get prescription from the pharmacy.  Denies further known exposure or infection.  Denies abdominal pain or flank pain, urinary symptoms.  Has Nexplanon implanted no concern for pregnancy.   Past Medical History:  Diagnosis Date   ADHD (attention deficit hyperactivity disorder)    Anxiety    Asthma    Depression    Seasonal allergies     Patient Active Problem List   Diagnosis Date Noted   MDD (major depressive disorder), severe (HCC) 11/14/2017   Suicide attempt by hanging (HCC) 11/14/2017   Cannabis use disorder, mild, abuse 11/14/2017   DMDD (disruptive mood dysregulation disorder) (HCC) 05/31/2017   Severe recurrent major depression without psychotic features (HCC) 05/30/2017   Migraine without aura and without status migrainosus, not intractable 10/16/2015   Episodic tension-type headache, not intractable 10/16/2015   Intermittent explosive disorder 10/16/2015   Medication adverse effect 09/10/2015   Outbursts of anger 09/10/2015   Episodic memory loss 09/10/2015   Bipolar mood disorder (HCC) 05/21/2015   Attention deficit hyperactivity disorder (ADHD) 05/21/2015   GAD (generalized anxiety disorder) 05/21/2015   Adjustment disorder with other symptom 01/24/2014    Past Surgical  History:  Procedure Laterality Date   WISDOM TOOTH EXTRACTION      OB History     Gravida  0   Para  0   Term  0   Preterm  0   AB  0   Living         SAB  0   IAB  0   Ectopic  0   Multiple      Live Births               Home Medications    Prior to Admission medications   Medication Sig Start Date End Date Taking? Authorizing Provider  ipratropium (ATROVENT) 0.03 % nasal spray Place 2 sprays into both nostrils every 12 (twelve) hours. 01/18/23  Yes Mercia Dowe, Elita Boone, NP  magic mouthwash (nystatin, lidocaine, diphenhydrAMINE, alum & mag hydroxide) suspension Swish and spit 5 mLs 4 (four) times daily as needed for mouth pain. 01/18/23  Yes Jilleen Essner R, NP  metroNIDAZOLE (FLAGYL) 500 MG tablet Take 1 tablet (500 mg total) by mouth 2 (two) times daily. 01/18/23  Yes Ashaz Robling, Elita Boone, NP  albuterol (VENTOLIN HFA) 108 (90 Base) MCG/ACT inhaler Inhale 1-2 puffs into the lungs every 4 (four) hours as needed for wheezing or shortness of breath. 09/16/22   Mickie Bail, NP  atomoxetine (STRATTERA) 40 MG capsule Take 1 capsule (40 mg total) by mouth at bedtime. Patient not taking: Reported on 02/18/2020 11/19/17   Leata Mouse, MD  brompheniramine-pseudoephedrine-DM 30-2-10 MG/5ML syrup Take 5 mLs by mouth 4 (four) times daily as needed. Patient not taking:  Reported on 05/17/2022 08/15/21   [name], PA-C  cloNIDine (CATAPRES) 0.1 MG tablet Take 0.1 mg by mouth at bedtime. Patient not taking: Reported on 12/13/2019 11/07/19   [provider]  diphenhydrAMINE (BENADRYL) 25 mg capsule Take 1 capsule (25 mg total) by mouth at bedtime as needed for itching. Patient not taking: Reported on 05/17/2022 11/18/17   Leata Mouse, MD  fluconazole (DIFLUCAN) 150 MG tablet Take 1 tablet (150 mg total) by mouth every three (3) days as needed. Repeat if needed Patient not taking: Reported on 01/18/2023 11/02/22   Bing Neighbors, NP   FLUoxetine (PROZAC) 10 MG capsule Take 10 mg by mouth every evening. Patient not taking: Reported on 02/18/2020    [provider]  hydrOXYzine (ATARAX/VISTARIL) 25 MG tablet Take 25 mg by mouth 3 (three) times daily as needed. Patient not taking: Reported on 02/18/2020    [provider]  lamoTRIgine (LAMICTAL) 100 MG tablet Take 100 mg by mouth daily. Patient not taking: Reported on 02/18/2020 11/07/19   [provider]    Family History Family History  Problem Relation Age of Onset   Bipolar disorder Mother    Depression Mother    ADD / ADHD Brother    Bipolar disorder Maternal Grandmother    Depression Maternal Grandmother     Social History Social History   Tobacco Use   Smoking status: Never   Smokeless tobacco: Never  Vaping Use   Vaping status: Never Used  Substance Use Topics   Alcohol use: No    Alcohol/week: 0.0 standard drinks of alcohol   Drug use: Not Currently    Types: Marijuana    Comment: says she is around it.     Allergies   Dexedrine [dextroamphetamine sulfate er]   Review of Systems Review of Systems   Physical Exam Triage Vital Signs ED Triage Vitals  Encounter Vitals Group     BP 01/18/23 1149 109/73     Systolic BP Percentile --      Diastolic BP Percentile --      Pulse Rate 01/18/23 1149 87     Resp 01/18/23 1149 18     Temp 01/18/23 1149 98.9 F (37.2 C)     Temp src --      SpO2 01/18/23 1149 98 %     Weight --      Height --      Head Circumference --      Peak Flow --      Pain Score 01/18/23 1143 0     Pain Loc --      Pain Education --      Exclude from Growth Chart --    No data found.  Updated Vital Signs BP 109/73   Pulse 87   Temp 98.9 F (37.2 C)   Resp 18   SpO2 98%   Visual Acuity Right Eye Distance:   Left Eye Distance:   Bilateral Distance:    Right Eye Near:   Left Eye Near:    Bilateral Near:     Physical Exam Constitutional:      Appearance: Normal appearance.   HENT:     Head: Normocephalic.     Right Ear: Tympanic membrane, ear canal and external ear normal.     Left Ear: Tympanic membrane, ear canal and external ear normal.     Nose: Congestion present. No rhinorrhea.     Mouth/Throat:     Mouth: Mucous membranes are moist.  Pharynx: Oropharynx is clear. No oropharyngeal exudate or posterior oropharyngeal erythema.  Eyes:     Extraocular Movements: Extraocular movements intact.  Cardiovascular:     Rate and Rhythm: Normal rate and regular rhythm.     Pulses: Normal pulses.     Heart sounds: Normal heart sounds.  Pulmonary:     Effort: Pulmonary effort is normal.     Breath sounds: Normal breath sounds.  Genitourinary:    Comments: deferred Neurological:     Mental Status: She is alert and oriented to person, place, and time. Mental status is at baseline.      UC Treatments / Results  Labs (all labs ordered are listed, but only abnormal results are displayed) Labs Reviewed  SARS CORONAVIRUS 2 (TAT 6-24 HRS)  CERVICOVAGINAL ANCILLARY ONLY    EKG   Radiology No results found.  Procedures Procedures (including critical care time)  Medications Ordered in UC Medications - No data to display  Initial Impression / Assessment and Plan / UC Course  I have reviewed the triage vital signs and the nursing notes.  Pertinent labs & imaging results that were available during my care of the patient were reviewed by me and considered in my medical decision making (see chart for details).  Viral URI with cough, vaginal discharge  Patient is in no signs of distress nor toxic appearing.  Vital signs are stable.  Low suspicion for pneumonia, pneumothorax or bronchitis and therefore will defer imaging. COVID test is pending, reviewed quarantine guidelines per CDC recommendations, healthy adult, does not qualify for antivirals. Prescribed Magic mouthwash and Atrovent nasal spray. May use additional over-the-counter medications as needed  for supportive care.  May follow-up with urgent care as needed if symptoms persist or worsen.Note given.    Prophylactically treating for BV if she did not complete prior treatment, metronidazole sent to pharmacy and advised abstaining from alcohol during use, STI labs pending will treat per protocol, advised abstinence until lab results, and/or treatment is complete, advised condom use during all sexual encounters moving, may follow-up with urgent care as needed  Final Clinical Impressions(s) / UC Diagnoses   Final diagnoses:  Vaginal discharge  Viral URI with cough     Discharge Instructions      Your symptoms today are most likely being caused by a virus and should steadily improve in time it can take up to 7 to 10 days before you truly start to see a turnaround however things will get better  COVID test is pending up to 24 hours, you will be noted of positive test results only, if positive will need to stay home if experiencing fever  May use nasal spray every morning and every evening to help with congestion  May gargle and spit Magic mouthwash solution to help with sore throat    You can take Tylenol and/or Ibuprofen as needed for fever reduction and pain relief.   For cough: honey 1/2 to 1 teaspoon (you can dilute the honey in water or another fluid).  You can also use guaifenesin and dextromethorphan for cough. You can use a humidifier for chest congestion and cough.  If you don't have a humidifier, you can sit in the bathroom with the hot shower running.      For sore throat: try warm salt water gargles, cepacol lozenges, throat spray, warm tea or water with lemon/honey, popsicles or ice, or OTC cold relief medicine for throat discomfort.   For congestion: take a daily anti-histamine like Zyrtec, Claritin,  and a oral decongestant, such as pseudoephedrine.  You can also use Flonase 1-2 sprays in each nostril daily.   It is important to stay hydrated: drink plenty of fluids  (water, gatorade/powerade/pedialyte, juices, or teas) to keep your throat moisturized and help further relieve irritation/discomfort.  Today you are being treated prophylactically for  Bacterial vaginosis   Take Metronidazole 500 mg twice a day for 7 days, do not drink alcohol while using medication, this will make you feel sick   Bacterial vaginosis which results from an overgrowth of one on several organisms that are normally present in your vagina. Vaginosis is an inflammation of the vagina that can result in discharge, itching and pain.  Labs pending 2-3 days, you will be contacted if positive for any sti and treatment will be sent to the pharmacy, you will have to return to the clinic if positive for gonorrhea to receive treatment   Please refrain from having sex until labs results, if positive please refrain from having sex until treatment complete and symptoms resolve   If positive for Chlamydia  gonorrhea or trichomoniasis please notify partner or partners so they may tested as well  Moving forward, it is recommended you use some form of protection against the transmission of sti infections  such as condoms or dental dams with each sexual encounter     In addition: Avoid baths, hot tubs and whirlpool spas.  Don't use scented or harsh soaps Avoid irritants. These include scented tampons and pads. Wipe from front to back after using the toilet. Don't douche. Your vagina doesn't require cleansing other than normal bathing.  Use a condom.  Wear cotton underwear, this fabric absorbs some moisture.         ED Prescriptions     Medication Sig Dispense Auth. Provider   ipratropium (ATROVENT) 0.03 % nasal spray Place 2 sprays into both nostrils every 12 (twelve) hours. 30 mL Salli Quarry R, NP   magic mouthwash (nystatin, lidocaine, diphenhydrAMINE, alum & mag hydroxide) suspension Swish and spit 5 mLs 4 (four) times daily as needed for mouth pain. 180 mL Sireen Halk R, NP    metroNIDAZOLE (FLAGYL) 500 MG tablet Take 1 tablet (500 mg total) by mouth 2 (two) times daily. 14 tablet Axel Meas, Elita Boone, NP      PDMP not reviewed this encounter.   Valinda Hoar, NP 01/21/23 [phone]

## 2023-01-18 NOTE — ED Triage Notes (Signed)
Patient to Urgent Care with complaints of fevers/ headaches/ nasal congestion. Reports symptoms started Sunday. Reports concerns about a sinus infection.  Also reports vaginal discharge- concerns about BV. Symptoms started approx 1 week ago.

## 2023-01-18 NOTE — Discharge Instructions (Addendum)
Your symptoms today are most likely being caused by a virus and should steadily improve in time it can take up to 7 to 10 days before you truly start to see a turnaround however things will get better  COVID test is pending up to 24 hours, you will be noted of positive test results only, if positive will need to stay home if experiencing fever  May use nasal spray every morning and every evening to help with congestion  May gargle and spit Magic mouthwash solution to help with sore throat    You can take Tylenol and/or Ibuprofen as needed for fever reduction and pain relief.   For cough: honey 1/2 to 1 teaspoon (you can dilute the honey in water or another fluid).  You can also use guaifenesin and dextromethorphan for cough. You can use a humidifier for chest congestion and cough.  If you don't have a humidifier, you can sit in the bathroom with the hot shower running.      For sore throat: try warm salt water gargles, cepacol lozenges, throat spray, warm tea or water with lemon/honey, popsicles or ice, or OTC cold relief medicine for throat discomfort.   For congestion: take a daily anti-histamine like Zyrtec, Claritin, and a oral decongestant, such as pseudoephedrine.  You can also use Flonase 1-2 sprays in each nostril daily.   It is important to stay hydrated: drink plenty of fluids (water, gatorade/powerade/pedialyte, juices, or teas) to keep your throat moisturized and help further relieve irritation/discomfort.  Today you are being treated prophylactically for  Bacterial vaginosis   Take Metronidazole 500 mg twice a day for 7 days, do not drink alcohol while using medication, this will make you feel sick   Bacterial vaginosis which results from an overgrowth of one on several organisms that are normally present in your vagina. Vaginosis is an inflammation of the vagina that can result in discharge, itching and pain.  Labs pending 2-3 days, you will be contacted if positive for any sti  and treatment will be sent to the pharmacy, you will have to return to the clinic if positive for gonorrhea to receive treatment   Please refrain from having sex until labs results, if positive please refrain from having sex until treatment complete and symptoms resolve   If positive for Chlamydia  gonorrhea or trichomoniasis please notify partner or partners so they may tested as well  Moving forward, it is recommended you use some form of protection against the transmission of sti infections  such as condoms or dental dams with each sexual encounter     In addition: Avoid baths, hot tubs and whirlpool spas.  Don't use scented or harsh soaps Avoid irritants. These include scented tampons and pads. Wipe from front to back after using the toilet. Don't douche. Your vagina doesn't require cleansing other than normal bathing.  Use a condom.  Wear cotton underwear, this fabric absorbs some moisture.

## 2023-01-19 ENCOUNTER — Ambulatory Visit: Payer: Medicaid Other

## 2023-01-19 LAB — CERVICOVAGINAL ANCILLARY ONLY
Bacterial Vaginitis (gardnerella): POSITIVE — AB
Candida Glabrata: NEGATIVE
Candida Vaginitis: NEGATIVE
Chlamydia: NEGATIVE
Comment: NEGATIVE
Comment: NEGATIVE
Comment: NEGATIVE
Comment: NEGATIVE
Comment: NEGATIVE
Comment: NORMAL
Neisseria Gonorrhea: NEGATIVE
Trichomonas: NEGATIVE

## 2023-01-19 LAB — SARS CORONAVIRUS 2 (TAT 6-24 HRS): SARS Coronavirus 2: NEGATIVE

## 2023-02-23 ENCOUNTER — Encounter: Payer: Self-pay | Admitting: Emergency Medicine

## 2023-02-23 ENCOUNTER — Ambulatory Visit
Admission: EM | Admit: 2023-02-23 | Discharge: 2023-02-23 | Disposition: A | Discharge status: Home / Self Care | Payer: Medicaid Other

## 2023-02-23 DIAGNOSIS — J209 Acute bronchitis, unspecified: Secondary | ICD-10-CM | POA: Diagnosis not present

## 2023-02-23 MED ORDER — METHYLPREDNISOLONE 4 MG PO TBPK: ORAL_TABLET | ORAL | 0 refills | Status: DC

## 2023-02-23 MED ORDER — AMOXICILLIN-POT CLAVULANATE 875-125 MG PO TABS: 1.0000 | ORAL_TABLET | Freq: Two times a day (BID) | ORAL | 0 refills | Status: DC

## 2023-02-23 NOTE — ED Provider Notes (Signed)
[name]    CSN: [id number] Arrival date & time: 02/23/23  1214      History   Chief Complaint Chief Complaint  Patient presents with   Cough   Nasal Congestion    HPI Connie Johnson is a 22 y.o. female.   Presents for evaluation of nasal congestion and a nonproductive cough beginning 2 months ago, over the last 4 days symptoms progressively worsen his cough has become more harsh, prominent and barking.  Associated shortness of breath with exertion and intermittent wheezing.  Known exposure to bronchitis.  History of asthma, has albuterol inhaler available.  Was evaluated in this urgent care approximately 1 month ago, diagnosed with a viral illness.  Has been using over-the-counter cough medicine which has been somewhat helpful but symptoms have not resolved.  Past Medical History:  Diagnosis Date   ADHD (attention deficit hyperactivity disorder)    Anxiety    Asthma    Depression    Seasonal allergies     Patient Active Problem List   Diagnosis Date Noted   MDD (major depressive disorder), severe (HCC) 11/14/2017   Suicide attempt by hanging (HCC) 11/14/2017   Cannabis use disorder, mild, abuse 11/14/2017   DMDD (disruptive mood dysregulation disorder) (HCC) 05/31/2017   Severe recurrent major depression without psychotic features (HCC) 05/30/2017   Migraine without aura and without status migrainosus, not intractable 10/16/2015   Episodic tension-type headache, not intractable 10/16/2015   Intermittent explosive disorder 10/16/2015   Medication adverse effect 09/10/2015   Outbursts of anger 09/10/2015   Episodic memory loss 09/10/2015   Bipolar mood disorder (HCC) 05/21/2015   Attention deficit hyperactivity disorder (ADHD) 05/21/2015   GAD (generalized anxiety disorder) 05/21/2015   Adjustment disorder with other symptom 01/24/2014    Past Surgical History:  Procedure Laterality Date   WISDOM TOOTH EXTRACTION      OB History     Gravida  0    Para  0   Term  0   Preterm  0   AB  0   Living         SAB  0   IAB  0   Ectopic  0   Multiple      Live Births               Home Medications    Prior to Admission medications   Medication Sig Start Date End Date Taking? Authorizing Provider  albuterol (VENTOLIN HFA) 108 (90 Base) MCG/ACT inhaler Inhale 1-2 puffs into the lungs every 4 (four) hours as needed for wheezing or shortness of breath. 09/16/22  Yes Mickie Bail, NP  amoxicillin-clavulanate (AUGMENTIN) 875-125 MG tablet Take 1 tablet by mouth every 12 (twelve) hours. 02/23/23  Yes Killian Ress, Elita Boone, NP  etonogestrel (NEXPLANON) 68 MG IMPL implant 1 each by Subdermal route once.   Yes [provider]  methylPREDNISolone (MEDROL DOSEPAK) 4 MG TBPK tablet Take 6 tabs by mouth daily  for 1 days, then 5 tabs for 1 days, then 4 tabs for 1 days, then 3 tabs for 1 days, 2 tabs for 1 days, then 1 tab by mouth daily for 1 days 02/23/23  Yes Elina Streng, Hansel Starling R, NP  atomoxetine (STRATTERA) 40 MG capsule Take 1 capsule (40 mg total) by mouth at bedtime. Patient not taking: Reported on 02/18/2020 11/19/17   Leata Mouse, MD  brompheniramine-pseudoephedrine-DM 30-2-10 MG/5ML syrup Take 5 mLs by mouth 4 (four) times daily as needed. Patient not taking: Reported  on 05/17/2022 08/15/21   [name], PA-C  cloNIDine (CATAPRES) 0.1 MG tablet Take 0.1 mg by mouth at bedtime. Patient not taking: Reported on 12/13/2019 11/07/19   [provider]  diphenhydrAMINE (BENADRYL) 25 mg capsule Take 1 capsule (25 mg total) by mouth at bedtime as needed for itching. Patient not taking: Reported on 05/17/2022 11/18/17   Leata Mouse, MD  fluconazole (DIFLUCAN) 150 MG tablet Take 1 tablet (150 mg total) by mouth every three (3) days as needed. Repeat if needed Patient not taking: Reported on 01/18/2023 11/02/22   Bing Neighbors, NP  FLUoxetine (PROZAC) 10 MG capsule Take 10 mg by mouth every  evening. Patient not taking: Reported on 02/18/2020    [provider]  hydrOXYzine (ATARAX/VISTARIL) 25 MG tablet Take 25 mg by mouth 3 (three) times daily as needed. Patient not taking: Reported on 02/18/2020    [provider]  ipratropium (ATROVENT) 0.03 % nasal spray Place 2 sprays into both nostrils every 12 (twelve) hours. 01/18/23   Valinda Hoar, NP  lamoTRIgine (LAMICTAL) 100 MG tablet Take 100 mg by mouth daily. Patient not taking: Reported on 02/18/2020 11/07/19   [provider]  magic mouthwash (nystatin, lidocaine, diphenhydrAMINE, alum & mag hydroxide) suspension Swish and spit 5 mLs 4 (four) times daily as needed for mouth pain. 01/18/23   Bertin Inabinet, Elita Boone, NP  metroNIDAZOLE (FLAGYL) 500 MG tablet Take 1 tablet (500 mg total) by mouth 2 (two) times daily. 01/18/23   Valinda Hoar, NP    Family History Family History  Problem Relation Age of Onset   Bipolar disorder Mother    Depression Mother    ADD / ADHD Brother    Bipolar disorder Maternal Grandmother    Depression Maternal Grandmother     Social History Social History   Tobacco Use   Smoking status: Never   Smokeless tobacco: Never  Vaping Use   Vaping status: Never Used  Substance Use Topics   Alcohol use: No    Alcohol/week: 0.0 standard drinks of alcohol   Drug use: Not Currently    Types: Marijuana    Comment: says she is around it.     Allergies   Dexedrine [dextroamphetamine sulfate er] and Dextroamphetamine sulfate   Review of Systems Review of Systems   Physical Exam Triage Vital Signs ED Triage Vitals  Encounter Vitals Group     BP 02/23/23 1334 114/77     Systolic BP Percentile --      Diastolic BP Percentile --      Pulse Rate 02/23/23 1334 75     Resp 02/23/23 1334 18     Temp 02/23/23 1334 98.9 F (37.2 C)     Temp Source 02/23/23 1334 Oral     SpO2 02/23/23 1334 97 %     Weight --      Height --      Head Circumference --      Peak Flow  --      Pain Score 02/23/23 1331 0     Pain Loc --      Pain Education --      Exclude from Growth Chart --    No data found.  Updated Vital Signs BP 114/77 (BP Location: Left Arm)   Pulse 75   Temp 98.9 F (37.2 C) (Oral)   Resp 18   SpO2 97%   Visual Acuity Right Eye Distance:   Left Eye Distance:   Bilateral Distance:  Right Eye Near:   Left Eye Near:    Bilateral Near:     Physical Exam Constitutional:      Appearance: Normal appearance.  HENT:     Right Ear: Tympanic membrane, ear canal and external ear normal.     Left Ear: Tympanic membrane, ear canal and external ear normal.     Nose: Congestion present. No rhinorrhea.     Mouth/Throat:     Mouth: Mucous membranes are moist.     Pharynx: Oropharynx is clear. No oropharyngeal exudate or posterior oropharyngeal erythema.  Eyes:     Extraocular Movements: Extraocular movements intact.  Cardiovascular:     Rate and Rhythm: Normal rate and regular rhythm.     Pulses: Normal pulses.     Heart sounds: Normal heart sounds.  Pulmonary:     Effort: Pulmonary effort is normal.     Breath sounds: Normal breath sounds.     Comments: Harsh barking cough with wheezing witnessed Skin:    General: Skin is warm and dry.  Neurological:     Mental Status: She is alert and oriented to person, place, and time. Mental status is at baseline.      UC Treatments / Results  Labs (all labs ordered are listed, but only abnormal results are displayed) Labs Reviewed - No data to display  EKG   Radiology No results found.  Procedures Procedures (including critical care time)  Medications Ordered in UC Medications - No data to display  Initial Impression / Assessment and Plan / UC Course  I have reviewed the triage vital signs and the nursing notes.  Pertinent labs & imaging results that were available during my care of the patient were reviewed by me and considered in my medical decision making (see chart for  details).  Acute bronchitis  Patient is in no signs of distress nor toxic appearing.  Vital signs are stable.  Low suspicion for pneumonia, pneumothorax  therefore will defer imaging.  Present for 2 months and sound of cough is consistent with croup.  Prescribed Augmentin and methylprednisolone, recommended use of inhaler and declined prescription for cough medications.May use additional over-the-counter medications as needed for supportive care.  May follow-up with urgent care as needed if symptoms persist or worsen.  Note given.   Final Clinical Impressions(s) / UC Diagnoses   Final diagnoses:  Acute bronchitis, unspecified organism     Discharge Instructions      you are being treated for bronchitis which is an inflammatory condition of the lungs and as your symptoms have been present for 2 months we will start you on antibiotic as well  Begin Augmentin every morning and every evening for 7 days  Begin methylprednisolone as directed, take with food  Continue use of inhaler as needed for shortness of breath and wheezing  May continue use of over-the-counter medicines for coughing    You can take Tylenol and/or Ibuprofen as needed for fever reduction and pain relief.   For cough: honey 1/2 to 1 teaspoon (you can dilute the honey in water or another fluid).  You can also use guaifenesin and dextromethorphan for cough. You can use a humidifier for chest congestion and cough.  If you don't have a humidifier, you can sit in the bathroom with the hot shower running.      For sore throat: try warm salt water gargles, cepacol lozenges, throat spray, warm tea or water with lemon/honey, popsicles or ice, or OTC cold relief medicine for throat  discomfort.   For congestion: take a daily anti-histamine like Zyrtec, Claritin, and a oral decongestant, such as pseudoephedrine.  You can also use Flonase 1-2 sprays in each nostril daily.   It is important to stay hydrated: drink plenty of fluids  (water, gatorade/powerade/pedialyte, juices, or teas) to keep your throat moisturized and help further relieve irritation/discomfort.    ED Prescriptions     Medication Sig Dispense Auth. Provider   methylPREDNISolone (MEDROL DOSEPAK) 4 MG TBPK tablet Take 6 tabs by mouth daily  for 1 days, then 5 tabs for 1 days, then 4 tabs for 1 days, then 3 tabs for 1 days, 2 tabs for 1 days, then 1 tab by mouth daily for 1 days 21 tablet Burnice Oestreicher R, NP   amoxicillin-clavulanate (AUGMENTIN) 875-125 MG tablet Take 1 tablet by mouth every 12 (twelve) hours. 14 tablet Mahala Rommel, Elita Boone, NP      PDMP not reviewed this encounter.   Valinda Hoar, NP 02/23/23 1433

## 2023-02-23 NOTE — Discharge Instructions (Signed)
you are being treated for bronchitis which is an inflammatory condition of the lungs and as your symptoms have been present for 2 months we will start you on antibiotic as well  Begin Augmentin every morning and every evening for 7 days  Begin methylprednisolone as directed, take with food  Continue use of inhaler as needed for shortness of breath and wheezing  May continue use of over-the-counter medicines for coughing    You can take Tylenol and/or Ibuprofen as needed for fever reduction and pain relief.   For cough: honey 1/2 to 1 teaspoon (you can dilute the honey in water or another fluid).  You can also use guaifenesin and dextromethorphan for cough. You can use a humidifier for chest congestion and cough.  If you don't have a humidifier, you can sit in the bathroom with the hot shower running.      For sore throat: try warm salt water gargles, cepacol lozenges, throat spray, warm tea or water with lemon/honey, popsicles or ice, or OTC cold relief medicine for throat discomfort.   For congestion: take a daily anti-histamine like Zyrtec, Claritin, and a oral decongestant, such as pseudoephedrine.  You can also use Flonase 1-2 sprays in each nostril daily.   It is important to stay hydrated: drink plenty of fluids (water, gatorade/powerade/pedialyte, juices, or teas) to keep your throat moisturized and help further relieve irritation/discomfort.

## 2023-02-23 NOTE — ED Triage Notes (Signed)
Pt has had a cough for 1 month and it has gotten worse in the past week. She has tried OTC cough syrup for her symptoms.

## 2023-03-11 ENCOUNTER — Ambulatory Visit: Payer: Medicaid Other

## 2023-03-11 ENCOUNTER — Ambulatory Visit
Admission: RE | Admit: 2023-03-11 | Discharge: 2023-03-11 | Disposition: A | Discharge status: Home / Self Care | Payer: Medicaid Other | Source: Ambulatory Visit | Attending: Emergency Medicine

## 2023-03-11 VITALS — BP 129/84 | HR 103 | Temp 98.9°F | Resp 18

## 2023-03-11 DIAGNOSIS — R058 Other specified cough: Secondary | ICD-10-CM | POA: Diagnosis not present

## 2023-03-11 MED ORDER — BENZONATATE 100 MG PO CAPS: 100.0000 mg | ORAL_CAPSULE | Freq: Three times a day (TID) | ORAL | 0 refills | Status: DC | PRN

## 2023-03-11 NOTE — ED Triage Notes (Signed)
PROVIDER TRIAGE  

## 2023-03-11 NOTE — ED Provider Notes (Signed)
Connie Johnson    CSN: [id number] Arrival date & time: 03/11/23  1437      History   Chief Complaint Chief Complaint  Patient presents with   Cough    Entered by patient    HPI Connie Johnson is a 22 y.o. female.  Patient presents with congestion and cough x 2 weeks.  She states her cough is productive and she is concerned for pneumonia.  No fever or shortness of breath.  No OTC medications taken today.  She used her albuterol inhaler last night.  Her medical history includes asthma, allergies, depression, anxiety, bipolar disorder, disruptive mood dysregulation disorder, intermittent explosive disorder, outbursts of anger, ADHD.  Patient was seen at this urgent care on 02/23/2023; diagnosed with acute bronchitis; treated with Augmentin and Medrol Dosepak.  LMP current.   The history is provided by the patient and medical records.    Past Medical History:  Diagnosis Date   ADHD (attention deficit hyperactivity disorder)    Anxiety    Asthma    Depression    Seasonal allergies     Patient Active Problem List   Diagnosis Date Noted   MDD (major depressive disorder), severe (HCC) 11/14/2017   Suicide attempt by hanging (HCC) 11/14/2017   Cannabis use disorder, mild, abuse 11/14/2017   DMDD (disruptive mood dysregulation disorder) (HCC) 05/31/2017   Severe recurrent major depression without psychotic features (HCC) 05/30/2017   Migraine without aura and without status migrainosus, not intractable 10/16/2015   Episodic tension-type headache, not intractable 10/16/2015   Intermittent explosive disorder 10/16/2015   Medication adverse effect 09/10/2015   Outbursts of anger 09/10/2015   Episodic memory loss 09/10/2015   Bipolar mood disorder (HCC) 05/21/2015   Attention deficit hyperactivity disorder (ADHD) 05/21/2015   GAD (generalized anxiety disorder) 05/21/2015   Adjustment disorder with other symptom 01/24/2014    Past Surgical History:  Procedure Laterality  Date   WISDOM TOOTH EXTRACTION      OB History     Gravida  0   Para  0   Term  0   Preterm  0   AB  0   Living         SAB  0   IAB  0   Ectopic  0   Multiple      Live Births               Home Medications    Prior to Admission medications   Medication Sig Start Date End Date Taking? Authorizing Provider  benzonatate (TESSALON) 100 MG capsule Take 1 capsule (100 mg total) by mouth 3 (three) times daily as needed for cough. 03/11/23  Yes Mickie Bail, NP  albuterol (VENTOLIN HFA) 108 (90 Base) MCG/ACT inhaler Inhale 1-2 puffs into the lungs every 4 (four) hours as needed for wheezing or shortness of breath. 09/16/22   Mickie Bail, NP  amoxicillin-clavulanate (AUGMENTIN) 875-125 MG tablet Take 1 tablet by mouth every 12 (twelve) hours. 02/23/23   Valinda Hoar, NP  atomoxetine (STRATTERA) 40 MG capsule Take 1 capsule (40 mg total) by mouth at bedtime. Patient not taking: Reported on 02/18/2020 11/19/17   Leata Mouse, MD  brompheniramine-pseudoephedrine-DM 30-2-10 MG/5ML syrup Take 5 mLs by mouth 4 (four) times daily as needed. Patient not taking: Reported on 05/17/2022 08/15/21   [name], PA-C  cloNIDine (CATAPRES) 0.1 MG tablet Take 0.1 mg by mouth at bedtime. Patient not taking: Reported on 12/13/2019 11/07/19  [provider]  diphenhydrAMINE (BENADRYL) 25 mg capsule Take 1 capsule (25 mg total) by mouth at bedtime as needed for itching. Patient not taking: Reported on 05/17/2022 11/18/17   Leata Mouse, MD  etonogestrel (NEXPLANON) 68 MG IMPL implant 1 each by Subdermal route once.    [provider]  fluconazole (DIFLUCAN) 150 MG tablet Take 1 tablet (150 mg total) by mouth every three (3) days as needed. Repeat if needed Patient not taking: Reported on 01/18/2023 11/02/22   Bing Neighbors, NP  FLUoxetine (PROZAC) 10 MG capsule Take 10 mg by mouth every evening. Patient not taking: Reported on  02/18/2020    [provider]  hydrOXYzine (ATARAX/VISTARIL) 25 MG tablet Take 25 mg by mouth 3 (three) times daily as needed. Patient not taking: Reported on 02/18/2020    [provider]  ipratropium (ATROVENT) 0.03 % nasal spray Place 2 sprays into both nostrils every 12 (twelve) hours. 01/18/23   Valinda Hoar, NP  lamoTRIgine (LAMICTAL) 100 MG tablet Take 100 mg by mouth daily. Patient not taking: Reported on 02/18/2020 11/07/19   [provider]  magic mouthwash (nystatin, lidocaine, diphenhydrAMINE, alum & mag hydroxide) suspension Swish and spit 5 mLs 4 (four) times daily as needed for mouth pain. 01/18/23   White, Elita Boone, NP  methylPREDNISolone (MEDROL DOSEPAK) 4 MG TBPK tablet Take 6 tabs by mouth daily  for 1 days, then 5 tabs for 1 days, then 4 tabs for 1 days, then 3 tabs for 1 days, 2 tabs for 1 days, then 1 tab by mouth daily for 1 days 02/23/23   Valinda Hoar, NP  metroNIDAZOLE (FLAGYL) 500 MG tablet Take 1 tablet (500 mg total) by mouth 2 (two) times daily. 01/18/23   Valinda Hoar, NP    Family History Family History  Problem Relation Age of Onset   Bipolar disorder Mother    Depression Mother    ADD / ADHD Brother    Bipolar disorder Maternal Grandmother    Depression Maternal Grandmother     Social History Social History   Tobacco Use   Smoking status: Never   Smokeless tobacco: Never  Vaping Use   Vaping status: Never Used  Substance Use Topics   Alcohol use: No    Alcohol/week: 0.0 standard drinks of alcohol   Drug use: Not Currently    Types: Marijuana    Comment: says she is around it.     Allergies   Dexedrine [dextroamphetamine sulfate er] and Dextroamphetamine sulfate   Review of Systems Review of Systems  Constitutional:  Negative for chills and fever.  HENT:  Positive for congestion. Negative for ear pain and sore throat.   Respiratory:  Positive for cough. Negative for shortness of breath and  wheezing.   Cardiovascular:  Negative for chest pain and palpitations.     Physical Exam Triage Vital Signs ED Triage Vitals  Encounter Vitals Group     BP      Systolic BP Percentile      Diastolic BP Percentile      Pulse      Resp      Temp      Temp src      SpO2      Weight      Height      Head Circumference      Peak Flow      Pain Score      Pain Loc  Pain Education      Exclude from Growth Chart    No data found.  Updated Vital Signs BP 129/84   Pulse (!) 103   Temp 98.9 F (37.2 C)   Resp 18   SpO2 98%   Visual Acuity Right Eye Distance:   Left Eye Distance:   Bilateral Distance:    Right Eye Near:   Left Eye Near:    Bilateral Near:     Physical Exam Constitutional:      General: She is not in acute distress. HENT:     Right Ear: Tympanic membrane normal.     Left Ear: Tympanic membrane normal.     Nose: Nose normal.     Mouth/Throat:     Mouth: Mucous membranes are moist.     Pharynx: Oropharynx is clear.  Cardiovascular:     Rate and Rhythm: Normal rate and regular rhythm.     Heart sounds: Normal heart sounds.  Pulmonary:     Effort: Pulmonary effort is normal. No respiratory distress.     Breath sounds: Normal breath sounds.  Skin:    General: Skin is warm and dry.  Neurological:     Mental Status: She is alert.      UC Treatments / Results  Labs (all labs ordered are listed, but only abnormal results are displayed) Labs Reviewed - No data to display  EKG   Radiology DG Chest 2 View Result Date: 03/11/2023 CLINICAL DATA:  Productive cough for 2 weeks. EXAM: CHEST - 2 VIEW COMPARISON:  None Available. FINDINGS: The heart size and mediastinal contours are within normal limits. Both lungs are clear. The visualized skeletal structures are unremarkable. IMPRESSION: Normal exam. Electronically Signed   By: Danae Orleans M.D.   On: 03/11/2023 16:59    Procedures Procedures (including critical care time)  Medications  Ordered in UC Medications - No data to display  Initial Impression / Assessment and Plan / UC Course  I have reviewed the triage vital signs and the nursing notes.  Pertinent labs & imaging results that were available during my care of the patient were reviewed by me and considered in my medical decision making (see chart for details).    Productive cough.  CXR negative.  Lungs are clear and O2 sat is 98% on room air.  Treating cough with Tessalon Perles.  Zyrtec as needed.  Instructed patient to follow-up with her PCP.  Education provided on cough.  She agrees to plan of care.   Final Clinical Impressions(s) / UC Diagnoses   Final diagnoses:  Productive cough     Discharge Instructions      Take the Tessalon Perles as directed for cough.  Follow-up with your primary care provider.     ED Prescriptions     Medication Sig Dispense Auth. Provider   benzonatate (TESSALON) 100 MG capsule Take 1 capsule (100 mg total) by mouth 3 (three) times daily as needed for cough. 21 capsule Mickie Bail, NP      PDMP not reviewed this encounter.   Mickie Bail, NP 03/11/23 1710

## 2023-03-11 NOTE — Discharge Instructions (Addendum)
Take the Tessalon Perles as directed for cough.  Follow up with your primary care provider.    

## 2023-03-14 DIAGNOSIS — J45909 Unspecified asthma, uncomplicated: Secondary | ICD-10-CM | POA: Diagnosis not present

## 2023-03-14 DIAGNOSIS — Z7951 Long term (current) use of inhaled steroids: Secondary | ICD-10-CM | POA: Diagnosis not present

## 2023-03-14 DIAGNOSIS — F39 Unspecified mood [affective] disorder: Secondary | ICD-10-CM | POA: Diagnosis not present

## 2023-03-14 DIAGNOSIS — F909 Attention-deficit hyperactivity disorder, unspecified type: Secondary | ICD-10-CM | POA: Diagnosis not present

## 2023-03-14 DIAGNOSIS — Z888 Allergy status to other drugs, medicaments and biological substances status: Secondary | ICD-10-CM | POA: Diagnosis not present

## 2023-03-14 DIAGNOSIS — R053 Chronic cough: Secondary | ICD-10-CM | POA: Diagnosis not present

## 2023-03-14 DIAGNOSIS — R0602 Shortness of breath: Secondary | ICD-10-CM | POA: Diagnosis not present

## 2023-03-28 ENCOUNTER — Encounter: Payer: Self-pay | Admitting: Internal Medicine

## 2023-03-28 ENCOUNTER — Ambulatory Visit: Payer: Medicaid Other | Admitting: Internal Medicine

## 2023-03-28 VITALS — BP 102/68 | HR 98 | Ht 63.0 in | Wt 145.4 lb

## 2023-03-28 DIAGNOSIS — G501 Atypical facial pain: Secondary | ICD-10-CM

## 2023-03-28 DIAGNOSIS — J069 Acute upper respiratory infection, unspecified: Secondary | ICD-10-CM

## 2023-03-28 DIAGNOSIS — J3489 Other specified disorders of nose and nasal sinuses: Secondary | ICD-10-CM | POA: Diagnosis not present

## 2023-03-28 DIAGNOSIS — J4531 Mild persistent asthma with (acute) exacerbation: Secondary | ICD-10-CM | POA: Diagnosis not present

## 2023-03-28 LAB — POC COVID19 BINAXNOW: SARS Coronavirus 2 Ag: NEGATIVE

## 2023-03-28 MED ORDER — FLUTICASONE PROPIONATE HFA 44 MCG/ACT IN AERO: 1.0000 | INHALATION_SPRAY | Freq: Two times a day (BID) | RESPIRATORY_TRACT | 0 refills | Status: AC

## 2023-03-28 NOTE — Progress Notes (Signed)
Subjective:    Patient ID: Connie Johnson, female    DOB: [birth year], 22 y.o.   MRN: [id number]  HPI  Discussed the use of AI scribe software for clinical note transcription with the patient, who gave verbal consent to proceed.  The patient, with a reported history of asthma, has been experiencing a persistent cough for approximately six to eight weeks. Initially diagnosed with bronchitis, she was prescribed Augmentin and a Medrol Dosepak. Despite this treatment, the cough persisted, leading to subsequent visits to an urgent care facility and an emergency room. At these visits, she was given Tessalon, a cough suppressant, and additional oral steroids, respectively. The patient reported temporary relief following the administration of steroids, but the cough returned and worsened after three days. The cough is described as uncomfortable and painful, causing a sensation of lung inflammation. Occasionally, the cough produces green and yellow sputum, but this is not consistent.  In addition to the cough, the patient has been experiencing shortness of breath, facial pressure, and a runny nose, which started two days prior to the consultation. She also reported intermittent episodes of feeling hot and cold, as well as diarrhea. The patient has a history of smoking but had stopped for a few days at the time of the consultation. She also reported using an albuterol inhaler for asthma since middle school.  The patient had a chest x-ray at an urgent care facility to rule out pneumonia, the results of which were reportedly normal.       Review of Systems   Past Medical History:  Diagnosis Date   ADHD (attention deficit hyperactivity disorder)    Anxiety    Asthma    Depression    Seasonal allergies     Current Outpatient Medications  Medication Sig Dispense Refill   albuterol (VENTOLIN HFA) 108 (90 Base) MCG/ACT inhaler Inhale 1-2 puffs into the lungs every 4 (four) hours as needed for wheezing  or shortness of breath. 54 g 0   amoxicillin-clavulanate (AUGMENTIN) 875-125 MG tablet Take 1 tablet by mouth every 12 (twelve) hours. 14 tablet 0   atomoxetine (STRATTERA) 40 MG capsule Take 1 capsule (40 mg total) by mouth at bedtime. (Patient not taking: Reported on 02/18/2020) 30 capsule 0   benzonatate (TESSALON) 100 MG capsule Take 1 capsule (100 mg total) by mouth 3 (three) times daily as needed for cough. 21 capsule 0   brompheniramine-pseudoephedrine-DM 30-2-10 MG/5ML syrup Take 5 mLs by mouth 4 (four) times daily as needed. (Patient not taking: Reported on 05/17/2022) 120 mL 0   cloNIDine (CATAPRES) 0.1 MG tablet Take 0.1 mg by mouth at bedtime. (Patient not taking: Reported on 12/13/2019)     diphenhydrAMINE (BENADRYL) 25 mg capsule Take 1 capsule (25 mg total) by mouth at bedtime as needed for itching. (Patient not taking: Reported on 05/17/2022) 30 capsule 0   etonogestrel (NEXPLANON) 68 MG IMPL implant 1 each by Subdermal route once.     fluconazole (DIFLUCAN) 150 MG tablet Take 1 tablet (150 mg total) by mouth every three (3) days as needed. Repeat if needed (Patient not taking: Reported on 01/18/2023) 2 tablet 0   FLUoxetine (PROZAC) 10 MG capsule Take 10 mg by mouth every evening. (Patient not taking: Reported on 02/18/2020)     hydrOXYzine (ATARAX/VISTARIL) 25 MG tablet Take 25 mg by mouth 3 (three) times daily as needed. (Patient not taking: Reported on 02/18/2020)     ipratropium (ATROVENT) 0.03 % nasal spray Place 2 sprays into both nostrils  every 12 (twelve) hours. 30 mL 12   lamoTRIgine (LAMICTAL) 100 MG tablet Take 100 mg by mouth daily. (Patient not taking: Reported on 02/18/2020)     magic mouthwash (nystatin, lidocaine, diphenhydrAMINE, alum & mag hydroxide) suspension Swish and spit 5 mLs 4 (four) times daily as needed for mouth pain. 180 mL 0   methylPREDNISolone (MEDROL DOSEPAK) 4 MG TBPK tablet Take 6 tabs by mouth daily  for 1 days, then 5 tabs for 1 days, then 4 tabs for 1  days, then 3 tabs for 1 days, 2 tabs for 1 days, then 1 tab by mouth daily for 1 days 21 tablet 0   metroNIDAZOLE (FLAGYL) 500 MG tablet Take 1 tablet (500 mg total) by mouth 2 (two) times daily. 14 tablet 0   No current facility-administered medications for this visit.    Allergies  Allergen Reactions   Dexedrine [Dextroamphetamine Sulfate Er] Other (See Comments)    Anger;irritability;hyperactivity   Dextroamphetamine Sulfate Other (See Comments)    Anger, irritability, hyperactivity    Family History  Problem Relation Age of Onset   Bipolar disorder Mother    Depression Mother    ADD / ADHD Brother    Bipolar disorder Maternal Grandmother    Depression Maternal Grandmother     Social History   Socioeconomic History   Marital status: Single    Spouse name: Not on file   Number of children: Not on file   Years of education: Not on file   Highest education level: Not on file  Occupational History   Not on file  Tobacco Use   Smoking status: Never   Smokeless tobacco: Never  Vaping Use   Vaping status: Never Used  Substance and Sexual Activity   Alcohol use: No    Alcohol/week: 0.0 standard drinks of alcohol   Drug use: Not Currently    Types: Marijuana    Comment: says she is around it.   Sexual activity: Yes    Birth control/protection: Implant  Other Topics Concern   Not on file  Social History Narrative   Sahnnon is a 8th grade student.   She attends [employer].   She lives with both parents and she has two siblings.   She enjoys basketball, playing on her phone, and eating.       Now she attends [employer].   Social Drivers of Corporate investment banker Strain: Not on file  Food Insecurity: Not on file  Transportation Needs: Not on file  Physical Activity: Not on file  Stress: Not on file  Social Connections: Not on file  Intimate Partner Violence: Not on file     Constitutional: Denies fever, malaise, fatigue, headache or  abrupt weight changes.  HEENT: Pt reports facial pressure, runny nose. Denies eye pain, eye redness, ear pain, ringing in the ears, wax buildup, nasal congestion, bloody nose, or sore throat. Respiratory: Pt reports shortness of breath, cough and sputum production. Denies difficulty breathing, shortness of breath.   Cardiovascular: Pt reports chest tightness. Denies chest pain, palpitations or swelling in the hands or feet.  Gastrointestinal: Pt reports intermittent diarrhea. Denies abdominal pain, bloating, constipation, or blood in the stool.  Musculoskeletal: Denies decrease in range of motion, difficulty with gait, muscle pain or joint pain and swelling.  Skin: Denies redness, rashes, lesions or ulcercations.  Neurological: Denies dizziness, difficulty with memory, difficulty with speech or problems with balance and coordination.    No other specific complaints in  a complete review of systems (except as listed in HPI above).      Objective:   Physical Exam  BP 102/68 (BP Location: Left Arm, Patient Position: Sitting, Cuff Size: Normal)   Pulse 98   Ht 5\' 3"  (1.6 m)   Wt 145 lb 6.4 oz (66 kg)   LMP  (LMP Unknown)   SpO2 100%   BMI 25.76 kg/m   Wt Readings from Last 3 Encounters:  03/22/22 137 lb (62.1 kg) (66%, Z= 0.41)*  12/18/21 126 lb 1.7 oz (57.2 kg) (48%, Z= -0.04)*  10/29/21 134 lb 14.7 oz (61.2 kg) (64%, Z= 0.37)*   * Growth percentiles are based on CDC (Girls, 2-20 Years) data.    General: Appears her stated age, appear unwell but in NAD. Skin: Warm, dry and intact. No rashes noted. HEENT: Head: normal shape and size, no sinus tenderness noted; Eyes: sclera white, no icterus, conjunctiva pink, PERRLA and EOMs intact; Ears: Tm's gray and intact, normal light reflex; Nose: mucosa boggy and moist, turbinates swollen; Throat/Mouth: Teeth present, mucosa erythema this and moist, + PND, no exudate, lesions or ulcerations noted.  Neck: No adenopathy noted. Cardiovascular:  Normal rate and rhythm. S1,S2 noted.  No murmur, rubs or gallops noted.  Pulmonary/Chest: Normal effort and positive vesicular breath sounds. No respiratory distress. No wheezes, rales or ronchi noted.  Neurological: Alert and oriented.   BMET    Component Value Date/Time   NA 139 10/01/2020 1246   NA 143 05/23/2015 0928   NA 137 03/10/2013 0934   K 4.0 10/01/2020 1246   K 4.0 03/10/2013 0934   CL 107 10/01/2020 1246   CL 106 03/10/2013 0934   CO2 22 10/01/2020 1246   CO2 26 (H) 03/10/2013 0934   GLUCOSE 101 (H) 10/01/2020 1246   GLUCOSE 96 03/10/2013 0934   BUN 10 10/01/2020 1246   BUN 13 05/23/2015 0928   BUN 16 03/10/2013 0934   CREATININE 0.62 10/01/2020 1246   CREATININE 0.53 03/10/2013 0934   CALCIUM 9.7 10/01/2020 1246   CALCIUM 9.8 03/10/2013 0934   GFRNONAA >60 10/01/2020 1246   GFRAA NOT CALCULATED 07/31/2018 1934    Lipid Panel     Component Value Date/Time   CHOL 145 06/01/2017 0641   CHOL 126 05/23/2015 0928   TRIG 118 06/01/2017 0641   HDL 37 (L) 06/01/2017 0641   HDL 55 05/23/2015 0928   CHOLHDL 3.9 06/01/2017 0641   VLDL 24 06/01/2017 0641   LDLCALC 84 06/01/2017 0641   LDLCALC 53 05/23/2015 0928    CBC    Component Value Date/Time   WBC 6.7 10/01/2020 1246   RBC 5.65 (H) 10/01/2020 1246   HGB 17.6 (H) 10/01/2020 1246   HGB 15.3 05/23/2015 0928   HCT 48.5 (H) 10/01/2020 1246   HCT 43.7 05/23/2015 0928   PLT 237 10/01/2020 1246   PLT 220 05/23/2015 0928   MCV 85.8 10/01/2020 1246   MCV 85 05/23/2015 0928   MCV 80 03/10/2013 0934   MCH 31.2 10/01/2020 1246   MCHC 36.3 (H) 10/01/2020 1246   RDW 12.0 10/01/2020 1246   RDW 12.5 05/23/2015 0928   RDW 12.7 03/10/2013 0934   LYMPHSABS 1.9 05/23/2015 0928   LYMPHSABS 2.5 03/10/2013 0934   MONOABS 1.1 (H) 03/10/2013 0934   EOSABS 0.4 05/23/2015 0928   EOSABS 0.6 03/10/2013 0934   BASOSABS 0.0 05/23/2015 0928   BASOSABS 0.1 03/10/2013 0934    Hgb A1C Lab Results  Component Value Date  HGBA1C 4.6 (L) 06/01/2017            Assessment & Plan:  Assessment and Plan    Asthma exacerbation   Persistent cough and dyspnea for 6-8 weeks. Recent history of bronchitis treated with Augmentin and Medrol Dosepak. Patient has been using Albuterol for symptom relief. No signs of infection on examination. COVID test negative.   -Start Flovent inhaler, use twice daily. Rinse mouth after use.   -Continue Albuterol as needed for symptom relief.   -Follow-up in 1-2 weeks to assess response to treatment.    Upper respiratory symptoms   Recent onset of rhinorrhea and facial pressure. No fever, chills, or body aches reported.   -Observe symptoms. If she persists or worsens, consider evaluation for sinusitis.    Smoking   Patient reported cessation for the past few days.   -Encourage continued smoking cessation for overall health and improved respiratory function.     Followup with your pcp as scheduled Nicki Reaper, NP

## 2023-03-28 NOTE — Patient Instructions (Signed)
Asthma, Adult  Asthma is a long-term (chronic) condition that causes recurrent episodes in which the lower airways in the lungs become tight and narrow. The narrowing is caused by inflammation and tightening of the smooth muscle around the lower airways. Asthma episodes, also called asthma attacks or asthma flares, may cause coughing, making high-pitched whistling sounds when you breathe, most often when you breathe out (wheezing), shortness of breath, and chest pain. The airways may produce extra mucus caused by the inflammation and irritation. During an attack, it can be difficult to breathe. Asthma attacks can range from minor to life-threatening. Asthma cannot be cured, but medicines and lifestyle changes can help control it and treat acute attacks. It is important to keep your asthma well controlled so the condition does not interfere with your daily life. What are the causes? This condition is believed to be caused by inherited (genetic) and environmental factors, but its exact cause is not known. What can trigger an asthma attack? Many things can bring on an asthma attack or make symptoms worse. These triggers are different for every person. Common triggers include: Allergens and irritants like mold, dust, pet dander, cockroaches, pollen, air pollution, and chemical odors. Cigarette smoke. Weather changes and cold air. Stress and strong emotional responses such as crying or laughing hard. Certain medications such as aspirin or beta blockers. Infections and inflammatory conditions, such as the flu, a cold, pneumonia, or inflammation of the nasal membranes (rhinitis). Gastroesophageal reflux disease (GERD). What are the signs or symptoms? Symptoms may occur right after exposure to an asthma trigger or hours later and can vary by person. Common signs and symptoms include: Wheezing. Trouble breathing (shortness of breath). Excessive nighttime or early morning coughing. Chest  tightness. Tiredness (fatigue) with minimal activity. Difficulty talking in complete sentences. Poor exercise tolerance. How is this diagnosed? This condition is diagnosed based on: A physical exam and your medical history. Tests, which may include: Lung function studies to evaluate the flow of air in your lungs. Allergy tests. Imaging tests, such as X-rays. How is this treated? There is no cure, but symptoms can be controlled with proper treatment. Treatment usually involves: Identifying and avoiding your asthma triggers. Inhaled medicines. Two types are commonly used to treat asthma, depending on severity: Controller medicines. These help prevent asthma symptoms from occurring. They are taken every day. Fast-acting reliever or rescue medicines. These quickly relieve asthma symptoms. They are used as needed and provide short-term relief. Using other medicines, such as: Allergy medicines, such as antihistamines, if your asthma attacks are triggered by allergens. Immune medicines (immunomodulators). These are medicines that help control the immune system. Using supplemental oxygen. This is only needed during a severe episode. Creating an asthma action plan. An asthma action plan is a written plan for managing and treating your asthma attacks. This plan includes: A list of your asthma triggers and how to avoid them. Information about when medicines should be taken and when their dosage should be changed. Instructions about using a device called a peak flow meter. A peak flow meter measures how well the lungs are working and the severity of your asthma. It helps you monitor your condition. Follow these instructions at home: Take over-the-counter and prescription medicines only as told by your health care provider. Stay up to date on all vaccinations as recommended by your healthcare provider, including vaccines for the flu and pneumonia. Use a peak flow meter and keep track of your peak flow  readings. Understand and use your asthma   action plan to address any asthma flares. Do not smoke or allow anyone to smoke in your home. Contact a health care provider if: You have wheezing, shortness of breath, or a cough that is not responding to medicines. Your medicines are causing side effects, such as a rash, itching, swelling, or trouble breathing. You need to use a reliever medicine more than 2-3 times a week. Your peak flow reading is still at 50-79% of your personal best after following your action plan for 1 hour. You have a fever and shortness of breath. Get help right away if: You are getting worse and do not respond to treatment during an asthma attack. You are short of breath when at rest or when doing very little physical activity. You have difficulty eating, drinking, or talking. You have chest pain or tightness. You develop a fast heartbeat or palpitations. You have a bluish color to your lips or fingernails. You are light-headed or dizzy, or you faint. Your peak flow reading is less than 50% of your personal best. You feel too tired to breathe normally. These symptoms may be an emergency. Get help right away. Call 911. Do not wait to see if the symptoms will go away. Do not drive yourself to the hospital. Summary Asthma is a long-term (chronic) condition that causes recurrent episodes in which the airways become tight and narrow. Asthma episodes, also called asthma attacks or asthma flares, can cause coughing, wheezing, shortness of breath, and chest pain. Asthma cannot be cured, but medicines and lifestyle changes can help keep it well controlled and prevent asthma flares. Make sure you understand how to avoid triggers and how and when to use your medicines. Asthma attacks can range from minor to life-threatening. Get help right away if you have an asthma attack and do not respond to treatment with your usual rescue medicines. This information is not intended to replace  advice given to you by your health care provider. Make sure you discuss any questions you have with your health care provider. Document Revised: 12/31/2020 Document Reviewed: 12/22/2020 [employer] Patient Education  2024 [employer]  

## 2023-04-11 ENCOUNTER — Ambulatory Visit: Payer: Medicaid Other | Admitting: Family Medicine

## 2023-06-06 ENCOUNTER — Encounter: Payer: Self-pay | Admitting: Emergency Medicine

## 2023-06-06 ENCOUNTER — Other Ambulatory Visit: Payer: Self-pay

## 2023-06-06 ENCOUNTER — Ambulatory Visit
Admission: EM | Admit: 2023-06-06 | Discharge: 2023-06-06 | Disposition: A | Discharge status: Home / Self Care | Attending: Emergency Medicine | Admitting: Emergency Medicine

## 2023-06-06 DIAGNOSIS — J069 Acute upper respiratory infection, unspecified: Secondary | ICD-10-CM | POA: Diagnosis not present

## 2023-06-06 LAB — POC COVID19/FLU A&B COMBO
Covid Antigen, POC: NEGATIVE
Influenza A Antigen, POC: NEGATIVE
Influenza B Antigen, POC: NEGATIVE

## 2023-06-06 MED ORDER — VENTOLIN HFA 108 (90 BASE) MCG/ACT IN AERS: 2.0000 | INHALATION_SPRAY | Freq: Four times a day (QID) | RESPIRATORY_TRACT | 1 refills | Status: DC | PRN

## 2023-06-06 MED ORDER — ALBUTEROL SULFATE HFA 108 (90 BASE) MCG/ACT IN AERS: 2.0000 | INHALATION_SPRAY | Freq: Four times a day (QID) | RESPIRATORY_TRACT | 2 refills | Status: DC | PRN

## 2023-06-06 MED ORDER — VENTOLIN HFA 108 (90 BASE) MCG/ACT IN AERS: 2.0000 | INHALATION_SPRAY | Freq: Four times a day (QID) | RESPIRATORY_TRACT | 2 refills | Status: DC | PRN

## 2023-06-06 MED ORDER — PREDNISONE 10 MG PO TABS: ORAL_TABLET | ORAL | 0 refills | Status: AC

## 2023-06-06 NOTE — ED Provider Notes (Signed)
 Connie Johnson    CSN: [id number] Arrival date & time: 06/06/23  0940      History   Chief Complaint Chief Complaint  Patient presents with   URI    HPI Connie Johnson is a 23 y.o. female.   Patient presents for evaluation of nasal congestion, rhinorrhea, nonproductive cough, sore throat, shortness of breath and wheezing primarily at nighttime and intermittent diarrhea present for 3 days.  Associated sinus pressure across the bridge of the nose.  Sputum has become bright green in color.  No known sick contacts prior.  History of asthma, has run out of albuterol inhaler.  Tolerating food and liquids.  Past Medical History:  Diagnosis Date   ADHD (attention deficit hyperactivity disorder)    Anxiety    Asthma    Depression    Seasonal allergies     Patient Active Problem List   Diagnosis Date Noted   MDD (major depressive disorder), severe (HCC) 11/14/2017   Suicide attempt by hanging (HCC) 11/14/2017   Cannabis use disorder, mild, abuse 11/14/2017   DMDD (disruptive mood dysregulation disorder) (HCC) 05/31/2017   Severe recurrent major depression without psychotic features (HCC) 05/30/2017   Migraine without aura and without status migrainosus, not intractable 10/16/2015   Episodic tension-type headache, not intractable 10/16/2015   Intermittent explosive disorder 10/16/2015   Medication adverse effect 09/10/2015   Outbursts of anger 09/10/2015   Episodic memory loss 09/10/2015   Bipolar mood disorder (HCC) 05/21/2015   Attention deficit hyperactivity disorder (ADHD) 05/21/2015   GAD (generalized anxiety disorder) 05/21/2015   Adjustment disorder with other symptom 01/24/2014    Past Surgical History:  Procedure Laterality Date   WISDOM TOOTH EXTRACTION      OB History     Gravida  0   Para  0   Term  0   Preterm  0   AB  0   Living         SAB  0   IAB  0   Ectopic  0   Multiple      Live Births               Home  Medications    Prior to Admission medications   Medication Sig Start Date End Date Taking? Authorizing Provider  predniSONE (DELTASONE) 10 MG tablet Take 30 mg (3 tablets) every morning for 2 days, then take 20 mg (2 tablets) every morning for 2 days, then take 10 mg (1 tablet) every morning for 1 day 06/06/23  Yes Zeferino Mounts R, NP  albuterol (VENTOLIN HFA) 108 (90 Base) MCG/ACT inhaler Inhale 2 puffs into the lungs every 6 (six) hours as needed for wheezing or shortness of breath. 06/06/23   Ivanell Deshotel, Elita Boone, NP  fluticasone (FLOVENT HFA) 44 MCG/ACT inhaler Inhale 1 puff into the lungs 2 (two) times daily. 03/28/23   Lorre Munroe, NP    Family History Family History  Problem Relation Age of Onset   Bipolar disorder Mother    Depression Mother    ADD / ADHD Brother    Bipolar disorder Maternal Grandmother    Depression Maternal Grandmother     Social History Social History   Tobacco Use   Smoking status: Never   Smokeless tobacco: Never  Vaping Use   Vaping status: Never Used  Substance Use Topics   Alcohol use: No    Alcohol/week: 0.0 standard drinks of alcohol   Drug use: Not Currently    Types:  Marijuana    Comment: says she is around it.     Allergies   Dexedrine [dextroamphetamine sulfate er] and Dextroamphetamine sulfate   Review of Systems Review of Systems   Physical Exam Triage Vital Signs ED Triage Vitals [06/06/23 1053]  Encounter Vitals Group     BP 119/80     Systolic BP Percentile      Diastolic BP Percentile      Pulse Rate 89     Resp 18     Temp 97.9 F (36.6 C)     Temp Source Temporal     SpO2 97 %     Weight      Height      Head Circumference      Peak Flow      Pain Score 6     Pain Loc      Pain Education      Exclude from Growth Chart    No data found.  Updated Vital Signs BP 119/80 (BP Location: Left Arm)   Pulse 89   Temp 97.9 F (36.6 C) (Temporal)   Resp 18   SpO2 97%   Visual Acuity Right Eye Distance:    Left Eye Distance:   Bilateral Distance:    Right Eye Near:   Left Eye Near:    Bilateral Near:     Physical Exam Constitutional:      Appearance: Normal appearance.  HENT:     Head: Normocephalic.     Right Ear: Tympanic membrane, ear canal and external ear normal.     Left Ear: Tympanic membrane, ear canal and external ear normal.     Nose: Congestion present.     Mouth/Throat:     Pharynx: No oropharyngeal exudate or posterior oropharyngeal erythema.  Cardiovascular:     Rate and Rhythm: Normal rate and regular rhythm.     Pulses: Normal pulses.     Heart sounds: Normal heart sounds.  Pulmonary:     Effort: Pulmonary effort is normal.     Breath sounds: Normal breath sounds.  Musculoskeletal:     Cervical back: Normal range of motion and neck supple.  Neurological:     Mental Status: She is alert.      UC Treatments / Results  Labs (all labs ordered are listed, but only abnormal results are displayed) Labs Reviewed  POC COVID19/FLU A&B COMBO - Normal    EKG   Radiology No results found.  Procedures Procedures (including critical care time)  Medications Ordered in UC Medications - No data to display  Initial Impression / Assessment and Plan / UC Course  I have reviewed the triage vital signs and the nursing notes.  Pertinent labs & imaging results that were available during my care of the patient were reviewed by me and considered in my medical decision making (see chart for details).  Viral URI  Patient is in no signs of distress nor toxic appearing.  Vital signs are stable.  Low suspicion for pneumonia, pneumothorax or bronchitis and therefore will defer imaging.COVID and flu negative.  Scribe prednisone and albuterol inhaler.  Requested lower dose prednisone.May use additional over-the-counter medications as needed for supportive care. May follow-up with urgent care as needed if symptoms persist or worsen.  Note given.   Final Clinical  Impressions(s) / UC Diagnoses   Final diagnoses:  Viral URI     Discharge Instructions      Your symptoms today are most likely being caused by a virus  and should steadily improve in time it can take up to 7 to 10 days before you truly start to see a turnaround however things will get better    You can take Tylenol and/or Ibuprofen as needed for fever reduction and pain relief.   For cough: honey 1/2 to 1 teaspoon (you can dilute the honey in water or another fluid).  You can also use guaifenesin and dextromethorphan for cough. You can use a humidifier for chest congestion and cough.  If you don't have a humidifier, you can sit in the bathroom with the hot shower running.      For sore throat: try warm salt water gargles, cepacol lozenges, throat spray, warm tea or water with lemon/honey, popsicles or ice, or OTC cold relief medicine for throat discomfort.   For congestion: take a daily anti-histamine like Zyrtec, Claritin, and a oral decongestant, such as pseudoephedrine.  You can also use Flonase 1-2 sprays in each nostril daily.   It is important to stay hydrated: drink plenty of fluids (water, gatorade/powerade/pedialyte, juices, or teas) to keep your throat moisturized and help further relieve irritation/discomfort.    Labs pending 2-3 days, you will be contacted if positive for any sti and treatment will be sent to the pharmacy, you will have to return to the clinic if positive for gonorrhea to receive treatment   Please refrain from having sex until labs results, if positive please refrain from having sex until treatment complete and symptoms resolve   If positive for  Chlamydia  gonorrhea or trichomoniasis please notify partner or partners so they may tested as well  Moving forward, it is recommended you use some form of protection against the transmission of sti infections  such as condoms or dental dams with each sexual encounter     ED Prescriptions     Medication Sig  Dispense Auth. Provider   albuterol (VENTOLIN HFA) 108 (90 Base) MCG/ACT inhaler  (Status: Discontinued) Inhale 2 puffs into the lungs every 6 (six) hours as needed for wheezing or shortness of breath. 54 g Anthoney Sheppard R, NP   predniSONE (DELTASONE) 10 MG tablet Take 30 mg (3 tablets) every morning for 2 days, then take 20 mg (2 tablets) every morning for 2 days, then take 10 mg (1 tablet) every morning for 1 day 11 tablet Leanore Biggers R, NP   albuterol (VENTOLIN HFA) 108 (90 Base) MCG/ACT inhaler  (Status: Discontinued) Inhale 2 puffs into the lungs every 6 (six) hours as needed for wheezing or shortness of breath. 18 g Mackenzee Becvar R, NP   albuterol (VENTOLIN HFA) 108 (90 Base) MCG/ACT inhaler Inhale 2 puffs into the lungs every 6 (six) hours as needed for wheezing or shortness of breath. 18 g Valinda Hoar, NP      PDMP not reviewed this encounter.   Valinda Hoar, NP 06/06/23 1125

## 2023-06-06 NOTE — ED Triage Notes (Signed)
 Patient presents to [facility] for evaluation of 3 days of runny nose, cough, sore throat, chills and sinus pressure.

## 2023-06-06 NOTE — Discharge Instructions (Addendum)
Your symptoms today are most likely being caused by a virus and should steadily improve in time it can take up to 7 to 10 days before you truly start to see a turnaround however things will get better    You can take Tylenol and/or Ibuprofen as needed for fever reduction and pain relief.   For cough: honey 1/2 to 1 teaspoon (you can dilute the honey in water or another fluid).  You can also use guaifenesin and dextromethorphan for cough. You can use a humidifier for chest congestion and cough.  If you don't have a humidifier, you can sit in the bathroom with the hot shower running.      For sore throat: try warm salt water gargles, cepacol lozenges, throat spray, warm tea or water with lemon/honey, popsicles or ice, or OTC cold relief medicine for throat discomfort.   For congestion: take a daily anti-histamine like Zyrtec, Claritin, and a oral decongestant, such as pseudoephedrine.  You can also use Flonase 1-2 sprays in each nostril daily.   It is important to stay hydrated: drink plenty of fluids (water, gatorade/powerade/pedialyte, juices, or teas) to keep your throat moisturized and help further relieve irritation/discomfort.   Labs pending 2-3 days, you will be contacted if positive for any sti and treatment will be sent to the pharmacy, you will have to return to the clinic if positive for gonorrhea to receive treatment   Please refrain from having sex until labs results, if positive please refrain from having sex until treatment complete and symptoms resolve   If positive for  Chlamydia  gonorrhea or trichomoniasis please notify partner or partners so they may tested as well  Moving forward, it is recommended you use some form of protection against the transmission of sti infections  such as condoms or dental dams with each sexual encounter

## 2023-06-07 LAB — CERVICOVAGINAL ANCILLARY ONLY
Bacterial Vaginitis (gardnerella): POSITIVE — AB
Candida Glabrata: NEGATIVE
Candida Vaginitis: NEGATIVE
Chlamydia: NEGATIVE
Comment: NEGATIVE
Comment: NEGATIVE
Comment: NEGATIVE
Comment: NEGATIVE
Comment: NEGATIVE
Comment: NORMAL
Neisseria Gonorrhea: NEGATIVE
Trichomonas: NEGATIVE

## 2023-07-21 ENCOUNTER — Ambulatory Visit
Admission: RE | Admit: 2023-07-21 | Discharge: 2023-07-21 | Disposition: A | Discharge status: Home / Self Care | Source: Ambulatory Visit | Attending: Emergency Medicine | Admitting: Emergency Medicine

## 2023-07-21 VITALS — BP 112/77 | HR 78 | Temp 97.7°F | Resp 18

## 2023-07-21 DIAGNOSIS — N760 Acute vaginitis: Secondary | ICD-10-CM | POA: Insufficient documentation

## 2023-07-21 DIAGNOSIS — Z3202 Encounter for pregnancy test, result negative: Secondary | ICD-10-CM | POA: Insufficient documentation

## 2023-07-21 DIAGNOSIS — B9689 Other specified bacterial agents as the cause of diseases classified elsewhere: Secondary | ICD-10-CM | POA: Insufficient documentation

## 2023-07-21 DIAGNOSIS — N898 Other specified noninflammatory disorders of vagina: Secondary | ICD-10-CM | POA: Insufficient documentation

## 2023-07-21 LAB — POCT URINE PREGNANCY: Preg Test, Ur: NEGATIVE

## 2023-07-21 MED ORDER — METRONIDAZOLE 500 MG PO TABS: 500.0000 mg | ORAL_TABLET | Freq: Two times a day (BID) | ORAL | 0 refills | Status: AC

## 2023-07-21 NOTE — ED Triage Notes (Signed)
 Patient to Urgent Care with complaints of vaginal discharge w/ foul odor.   No concerns for STDs.

## 2023-07-21 NOTE — Discharge Instructions (Addendum)
 Take the metronidazole  as directed.  Your vaginal tests are pending.    Follow-up with your primary care provider or gynecologist if your symptoms are not improving.

## 2023-07-21 NOTE — ED Notes (Signed)
 Provided assistance with accessing MyChart account. Attempted to reset patient's MyChart password with patient at bedside. Unable to do so d/t 2-step verification having patient's old phone number.   MyChart phone number and business hours provided for patient.

## 2023-07-21 NOTE — ED Provider Notes (Signed)
 Arlander Bellman    CSN: [id number] Arrival date & time: 07/21/23  1722      History   Chief Complaint Chief Complaint  Patient presents with   Vaginal Discharge    HPI Connie Johnson is a 23 y.o. female.  Patient presents with malodorous vaginal discharge x 1 month.  She denies fever, rash, abdominal pain, pelvic pain, dysuria, hematuria.  She reports concern for bacterial vaginitis as she has had similar symptoms with this in the past.  She denies concern for STDs.  No treatments at home.  Patient was positive for bacterial vaginitis on 06/06/2023; no treatment at that time.  The history is provided by the patient and medical records.    Past Medical History:  Diagnosis Date   ADHD (attention deficit hyperactivity disorder)    Anxiety    Asthma    Depression    Seasonal allergies     Patient Active Problem List   Diagnosis Date Noted   MDD (major depressive disorder), severe (HCC) 11/14/2017   Suicide attempt by hanging (HCC) 11/14/2017   Cannabis use disorder, mild, abuse 11/14/2017   DMDD (disruptive mood dysregulation disorder) (HCC) 05/31/2017   Severe recurrent major depression without psychotic features (HCC) 05/30/2017   Migraine without aura and without status migrainosus, not intractable 10/16/2015   Episodic tension-type headache, not intractable 10/16/2015   Intermittent explosive disorder 10/16/2015   Medication adverse effect 09/10/2015   Outbursts of anger 09/10/2015   Episodic memory loss 09/10/2015   Bipolar mood disorder (HCC) 05/21/2015   Attention deficit hyperactivity disorder (ADHD) 05/21/2015   GAD (generalized anxiety disorder) 05/21/2015   Adjustment disorder with other symptom 01/24/2014    Past Surgical History:  Procedure Laterality Date   WISDOM TOOTH EXTRACTION      OB History     Gravida  0   Para  0   Term  0   Preterm  0   AB  0   Living         SAB  0   IAB  0   Ectopic  0   Multiple      Live Births                Home Medications    Prior to Admission medications   Medication Sig Start Date End Date Taking? Authorizing Provider  metroNIDAZOLE  (FLAGYL ) 500 MG tablet Take 1 tablet (500 mg total) by mouth 2 (two) times daily. 07/21/23  Yes Wellington Half, NP  albuterol  (VENTOLIN  HFA) 108 (90 Base) MCG/ACT inhaler Inhale 2 puffs into the lungs every 6 (six) hours as needed for wheezing or shortness of breath. 06/06/23   White, Maybelle Spatz, NP  fluticasone  (FLOVENT  HFA) 44 MCG/ACT inhaler Inhale 1 puff into the lungs 2 (two) times daily. 03/28/23   Carollynn Cirri, NP  predniSONE  (DELTASONE ) 10 MG tablet Take 30 mg (3 tablets) every morning for 2 days, then take 20 mg (2 tablets) every morning for 2 days, then take 10 mg (1 tablet) every morning for 1 day Patient not taking: Reported on 07/21/2023 06/06/23   Reena Canning, NP    Family History Family History  Problem Relation Age of Onset   Bipolar disorder Mother    Depression Mother    ADD / ADHD Brother    Bipolar disorder Maternal Grandmother    Depression Maternal Grandmother     Social History Social History   Tobacco Use   Smoking status: Never  Smokeless tobacco: Never  Vaping Use   Vaping status: Never Used  Substance Use Topics   Alcohol use: No    Alcohol/week: 0.0 standard drinks of alcohol   Drug use: Not Currently    Types: Marijuana    Comment: says she is around it.     Allergies   Dexedrine  [dextroamphetamine  sulfate er] and Dextroamphetamine  sulfate   Review of Systems Review of Systems  Constitutional:  Negative for chills and fever.  Gastrointestinal:  Negative for abdominal pain.  Genitourinary:  Positive for vaginal discharge. Negative for dysuria, hematuria and pelvic pain.  Skin:  Negative for color change and rash.     Physical Exam Triage Vital Signs ED Triage Vitals  Encounter Vitals Group     BP 07/21/23 1734 112/77     Systolic BP Percentile --      Diastolic BP Percentile  --      Pulse Rate 07/21/23 1734 78     Resp 07/21/23 1734 18     Temp 07/21/23 1734 97.7 F (36.5 C)     Temp src --      SpO2 07/21/23 1734 98 %     Weight --      Height --      Head Circumference --      Peak Flow --      Pain Score 07/21/23 1738 0     Pain Loc --      Pain Education --      Exclude from Growth Chart --    No data found.  Updated Vital Signs BP 112/77   Pulse 78   Temp 97.7 F (36.5 C)   Resp 18   SpO2 98%   Visual Acuity Right Eye Distance:   Left Eye Distance:   Bilateral Distance:    Right Eye Near:   Left Eye Near:    Bilateral Near:     Physical Exam Constitutional:      General: She is not in acute distress. HENT:     Mouth/Throat:     Mouth: Mucous membranes are moist.  Cardiovascular:     Rate and Rhythm: Normal rate and regular rhythm.  Pulmonary:     Effort: Pulmonary effort is normal. No respiratory distress.  Abdominal:     General: Bowel sounds are normal.     Palpations: Abdomen is soft.     Tenderness: There is no abdominal tenderness. There is no right CVA tenderness, left CVA tenderness, guarding or rebound.  Genitourinary:    Comments: Patient declines GU exam. Neurological:     Mental Status: She is alert.      UC Treatments / Results  Labs (all labs ordered are listed, but only abnormal results are displayed) Labs Reviewed  POCT URINE PREGNANCY  CERVICOVAGINAL ANCILLARY ONLY    EKG   Radiology No results found.  Procedures Procedures (including critical care time)  Medications Ordered in UC Medications - No data to display  Initial Impression / Assessment and Plan / UC Course  I have reviewed the triage vital signs and the nursing notes.  Pertinent labs & imaging results that were available during my care of the patient were reviewed by me and considered in my medical decision making (see chart for details).    Bacterial vaginitis, vaginal discharge, negative pregnancy test.  Patient obtained  self swab for testing.  Treating with metronidazole .  Instructed patient to abstain from sexual activity for at least 7 days.  Instructed her to follow-up  with her PCP or gynecologist if her symptoms are not improving.  Patient agrees to plan of care.   Final Clinical Impressions(s) / UC Diagnoses   Final diagnoses:  Bacterial vaginitis  Vaginal discharge  Negative pregnancy test     Discharge Instructions      Take the metronidazole  as directed.  Your vaginal tests are pending.    Follow-up with your primary care provider or gynecologist if your symptoms are not improving.      ED Prescriptions     Medication Sig Dispense Auth. Provider   metroNIDAZOLE  (FLAGYL ) 500 MG tablet Take 1 tablet (500 mg total) by mouth 2 (two) times daily. 14 tablet Wellington Half, NP      PDMP not reviewed this encounter.   Wellington Half, NP 07/21/23 [phone]

## 2023-07-22 LAB — CERVICOVAGINAL ANCILLARY ONLY
Bacterial Vaginitis (gardnerella): POSITIVE — AB
Candida Glabrata: NEGATIVE
Candida Vaginitis: NEGATIVE
Comment: NEGATIVE
Comment: NEGATIVE
Comment: NEGATIVE

## 2023-11-23 ENCOUNTER — Ambulatory Visit
Admission: EM | Admit: 2023-11-23 | Discharge: 2023-11-23 | Disposition: A | Discharge status: Home / Self Care | Attending: Emergency Medicine | Admitting: Emergency Medicine

## 2023-11-23 DIAGNOSIS — U071 COVID-19: Secondary | ICD-10-CM

## 2023-11-23 DIAGNOSIS — Z760 Encounter for issue of repeat prescription: Secondary | ICD-10-CM

## 2023-11-23 DIAGNOSIS — Z8709 Personal history of other diseases of the respiratory system: Secondary | ICD-10-CM | POA: Diagnosis not present

## 2023-11-23 LAB — POC COVID19/FLU A&B COMBO
Covid Antigen, POC: POSITIVE — AB
Influenza A Antigen, POC: NEGATIVE
Influenza B Antigen, POC: NEGATIVE

## 2023-11-23 MED ORDER — ALBUTEROL SULFATE HFA 108 (90 BASE) MCG/ACT IN AERS: 1.0000 | INHALATION_SPRAY | Freq: Four times a day (QID) | RESPIRATORY_TRACT | 0 refills | Status: AC | PRN

## 2023-11-23 NOTE — ED Triage Notes (Signed)
 Patient presents to [facility] for fever (99.0), cough, sneezing, body aches x 2 days. Contact with covid positive pt. Req covid/flu testing. Treating symptoms with dayquil and theraflu.

## 2023-11-23 NOTE — Discharge Instructions (Addendum)
 The COVID test is positive.   Use the albuterol  inhaler as directed.   Take Tylenol  or ibuprofen  as needed for fever or discomfort.  Take plain Mucinex as needed for congestion.  Rest and keep yourself hydrated.    Follow-up with your primary care provider if your symptoms are not improving.

## 2023-11-23 NOTE — ED Provider Notes (Signed)
 Connie Johnson    CSN: [id number] Arrival date & time: 11/23/23  9040      History   Chief Complaint Chief Complaint  Patient presents with   Cough    HPI Connie Johnson is a 23 y.o. female.  Patient presents with bodyaches, low-grade fever, sneezing, runny nose, cough x 2 days.  Tmax 99.  No shortness of breath, wheezing, vomiting, diarrhea.  Treatment attempted with OTC cold and flu medication.  She reports exposure to COVID and possibly flu and requests testing for both.  Her medical history includes asthma.  She has not needed to use her rescue inhaler and states she may have lost it.  The history is provided by the patient and medical records.    Past Medical History:  Diagnosis Date   ADHD (attention deficit hyperactivity disorder)    Anxiety    Asthma    Depression    Seasonal allergies     Patient Active Problem List   Diagnosis Date Noted   MDD (major depressive disorder), severe (HCC) 11/14/2017   Suicide attempt by hanging (HCC) 11/14/2017   Cannabis use disorder, mild, abuse 11/14/2017   DMDD (disruptive mood dysregulation disorder) (HCC) 05/31/2017   Severe recurrent major depression without psychotic features (HCC) 05/30/2017   Migraine without aura and without status migrainosus, not intractable 10/16/2015   Episodic tension-type headache, not intractable 10/16/2015   Intermittent explosive disorder 10/16/2015   Medication adverse effect 09/10/2015   Outbursts of anger 09/10/2015   Episodic memory loss 09/10/2015   Bipolar mood disorder (HCC) 05/21/2015   Attention deficit hyperactivity disorder (ADHD) 05/21/2015   GAD (generalized anxiety disorder) 05/21/2015   Adjustment disorder with other symptom 01/24/2014    Past Surgical History:  Procedure Laterality Date   WISDOM TOOTH EXTRACTION      OB History     Gravida  0   Para  0   Term  0   Preterm  0   AB  0   Living         SAB  0   IAB  0   Ectopic  0   Multiple       Live Births               Home Medications    Prior to Admission medications   Medication Sig Start Date End Date Taking? Authorizing Provider  albuterol  (VENTOLIN  HFA) 108 (90 Base) MCG/ACT inhaler Inhale 1-2 puffs into the lungs every 6 (six) hours as needed. 11/23/23  Yes Corlis Burnard DEL, NP  fluticasone  (FLOVENT  HFA) 44 MCG/ACT inhaler Inhale 1 puff into the lungs 2 (two) times daily. 03/28/23   Antonette Angeline ORN, NP  metroNIDAZOLE  (FLAGYL ) 500 MG tablet Take 1 tablet (500 mg total) by mouth 2 (two) times daily. 07/21/23   Corlis Burnard DEL, NP  predniSONE  (DELTASONE ) 10 MG tablet Take 30 mg (3 tablets) every morning for 2 days, then take 20 mg (2 tablets) every morning for 2 days, then take 10 mg (1 tablet) every morning for 1 day Patient not taking: Reported on 07/21/2023 06/06/23   Teresa Shelba SAUNDERS, NP    Family History Family History  Problem Relation Age of Onset   Bipolar disorder Mother    Depression Mother    ADD / ADHD Brother    Bipolar disorder Maternal Grandmother    Depression Maternal Grandmother     Social History Social History   Tobacco Use   Smoking status: Never  Smokeless tobacco: Never  Vaping Use   Vaping status: Every Day  Substance Use Topics   Alcohol use: Yes    Comment: social drinker   Drug use: Not Currently    Types: Marijuana    Comment: says she is around it.     Allergies   Dexedrine  [dextroamphetamine  sulfate er] and Dextroamphetamine  sulfate   Review of Systems Review of Systems  Constitutional:  Positive for fever. Negative for chills.  HENT:  Positive for rhinorrhea and sneezing. Negative for ear pain and sore throat.   Respiratory:  Positive for cough. Negative for shortness of breath and wheezing.   Cardiovascular:  Negative for chest pain and palpitations.  Gastrointestinal:  Negative for diarrhea and vomiting.     Physical Exam Triage Vital Signs ED Triage Vitals [11/23/23 1026]  Encounter Vitals Group     BP 121/85      Girls Systolic BP Percentile      Girls Diastolic BP Percentile      Boys Systolic BP Percentile      Boys Diastolic BP Percentile      Pulse Rate 78     Resp 16     Temp 98.5 F (36.9 C)     Temp Source Oral     SpO2 98 %     Weight      Height      Head Circumference      Peak Flow      Pain Score      Pain Loc      Pain Education      Exclude from Growth Chart    No data found.  Updated Vital Signs BP 121/85 (BP Location: Left Arm)   Pulse 78   Temp 98.5 F (36.9 C) (Oral)   Resp 16   LMP 11/16/2023 (Approximate)   SpO2 98%   Visual Acuity Right Eye Distance:   Left Eye Distance:   Bilateral Distance:    Right Eye Near:   Left Eye Near:    Bilateral Near:     Physical Exam Constitutional:      General: She is not in acute distress. HENT:     Right Ear: Tympanic membrane normal.     Left Ear: Tympanic membrane normal.     Nose: Congestion and rhinorrhea present.     Mouth/Throat:     Mouth: Mucous membranes are moist.     Pharynx: Oropharynx is clear.  Cardiovascular:     Rate and Rhythm: Normal rate and regular rhythm.     Heart sounds: Normal heart sounds.  Pulmonary:     Effort: Pulmonary effort is normal. No respiratory distress.     Breath sounds: Normal breath sounds. No wheezing, rhonchi or rales.  Neurological:     Mental Status: She is alert.      UC Treatments / Results  Labs (all labs ordered are listed, but only abnormal results are displayed) Labs Reviewed  POC COVID19/FLU A&B COMBO - Abnormal; Notable for the following components:      Result Value   Covid Antigen, POC Positive (*)    All other components within normal limits    EKG   Radiology No results found.  Procedures Procedures (including critical care time)  Medications Ordered in UC Medications - No data to display  Initial Impression / Assessment and Plan / UC Course  I have reviewed the triage vital signs and the nursing notes.  Pertinent labs &  imaging results that were available  during my care of the patient were reviewed by me and considered in my medical decision making (see chart for details).    COVID-19, history of asthma, medication refill for albuterol  inhaler.  Afebrile and vital signs are stable.  Lungs are clear and O2 sat is 98% on room air.  Rapid COVID is positive, flu negative.  Most recent lab work for kidney function available in chart is from 2023.  Discussed symptomatic treatment including albuterol  inhaler, Tylenol  or ibuprofen  as needed, plain Mucinex as needed, rest, hydration.  Education provided on COVID.  ED precautions given.  Patient agrees to plan of care.  Final Clinical Impressions(s) / UC Diagnoses   Final diagnoses:  COVID-19  History of asthma  Medication refill     Discharge Instructions      The COVID test is positive.   Use the albuterol  inhaler as directed.   Take Tylenol  or ibuprofen  as needed for fever or discomfort.  Take plain Mucinex as needed for congestion.  Rest and keep yourself hydrated.    Follow-up with your primary care provider if your symptoms are not improving.         ED Prescriptions     Medication Sig Dispense Auth. Provider   albuterol  (VENTOLIN  HFA) 108 (90 Base) MCG/ACT inhaler Inhale 1-2 puffs into the lungs every 6 (six) hours as needed. 18 g Corlis Burnard DEL, NP      PDMP not reviewed this encounter.   Corlis Burnard DEL, NP 11/23/23 1110

## 2024-01-25 ENCOUNTER — Ambulatory Visit (INDEPENDENT_AMBULATORY_CARE_PROVIDER_SITE_OTHER)

## 2024-01-25 ENCOUNTER — Ambulatory Visit
Admission: EM | Admit: 2024-01-25 | Discharge: 2024-01-25 | Disposition: A | Discharge status: Home / Self Care | Attending: Emergency Medicine | Admitting: Emergency Medicine

## 2024-01-25 ENCOUNTER — Encounter: Payer: Self-pay | Admitting: Emergency Medicine

## 2024-01-25 DIAGNOSIS — M79641 Pain in right hand: Secondary | ICD-10-CM

## 2024-01-25 DIAGNOSIS — S6991XA Unspecified injury of right wrist, hand and finger(s), initial encounter: Secondary | ICD-10-CM | POA: Diagnosis not present

## 2024-01-25 NOTE — Discharge Instructions (Addendum)
 Today you are evaluated for your hand pain  X-ray is pending and you will be notified of results via telephone  If there is a break in the bone then you may return to clinic for splinting which as stability and support to the break  Begin use of ibuprofen  taking 600 800 mg consistently to reduce inflammation and pain, may use Tylenol  additionally  May apply ice or heat over the affected area 10 to 15-minute intervals  Elevate on pillows whenever sitting and lying to help reduce swelling  If there is a break in the bone to follow-up with one of the orthopedic clinics listed on front page

## 2024-01-25 NOTE — ED Provider Notes (Addendum)
 Connie Johnson    CSN: [id number] Arrival date & time: 01/25/24  1310      History   Chief Complaint Chief Complaint  Patient presents with   Hand Injury    HPI Connie Johnson is a 23 y.o. female.   Patient presents for evaluation of pain and swelling to the 3rd and 4th finger on the right hand beginning within the hour after injury.  Endorses that she punched a door.  Now unable to bend the fingers but able to fully straighten, endorses mild numbness and tingling to the right fifth finger.  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   Migraine without aura and without status migrainosus, not intractable 10/16/2015   Episodic tension-type headache, not intractable 10/16/2015   Intermittent explosive disorder 10/16/2015   Medication adverse effect 09/10/2015   Outbursts of anger 09/10/2015   Episodic memory loss 09/10/2015   Bipolar mood disorder (HCC) 05/21/2015   Attention deficit hyperactivity disorder (ADHD) 05/21/2015   GAD (generalized anxiety disorder) 05/21/2015   Adjustment disorder with other symptom 01/24/2014    Past Surgical History:  Procedure Laterality Date   WISDOM TOOTH EXTRACTION      OB History     Gravida  0   Para  0   Term  0   Preterm  0   AB  0   Living         SAB  0   IAB  0   Ectopic  0   Multiple      Live Births               Home Medications    Prior to Admission medications   Medication Sig Start Date End Date Taking?  Authorizing Provider  etonogestrel  (NEXPLANON ) 68 MG IMPL implant Inject 68 mg into the skin once. 08/05/23  Yes [provider]  albuterol  (VENTOLIN  HFA) 108 (90 Base) MCG/ACT inhaler Inhale 1-2 puffs into the lungs every 6 (six) hours as needed. 11/23/23   Corlis Burnard DEL, NP  fluticasone  (FLOVENT  HFA) 44 MCG/ACT inhaler Inhale 1 puff into the lungs 2 (two) times daily. 03/28/23   Antonette Angeline ORN, NP  metroNIDAZOLE  (FLAGYL ) 500 MG tablet Take 1 tablet (500 mg total) by mouth 2 (two) times daily. 07/21/23   Corlis Burnard DEL, NP  predniSONE  (DELTASONE ) 10 MG tablet Take 30 mg (3 tablets) every morning for 2 days, then take 20 mg (2 tablets) every morning for 2 days, then take 10 mg (1 tablet) every morning for 1 day Patient not taking: Reported on 07/21/2023 06/06/23   Teresa Shelba SAUNDERS, NP    Family History Family History  Problem Relation Age of Onset   Bipolar disorder Mother    Depression Mother    ADD / ADHD Brother    Bipolar disorder Maternal Grandmother    Depression Maternal Grandmother     Social History Social History   Tobacco Use   Smoking status: Never   Smokeless tobacco: Never  Vaping Use   Vaping status: Every Day  Substance Use Topics   Alcohol use: Yes    Comment: social drinker   Drug use: Not Currently    Types: Marijuana    Comment: says she is around it.     Allergies   Dexedrine  [dextroamphetamine  sulfate er] and Dextroamphetamine  sulfate   Review of Systems Review of Systems   Physical Exam Triage Vital Signs ED Triage Vitals  Encounter Vitals Group     BP 01/25/24 1342 114/79     Girls Systolic BP Percentile --      Girls Diastolic BP Percentile --      Boys Systolic BP Percentile --      Boys Diastolic BP Percentile --      Pulse Rate 01/25/24 1342 71     Resp 01/25/24 1342 18     Temp 01/25/24 1342 99 F (37.2 C)     Temp Source 01/25/24 1342 Oral     SpO2 01/25/24 1342 97 %     Weight --      Height --      Head Circumference --       Peak Flow --      Pain Score 01/25/24 1339 6     Pain Loc --      Pain Education --      Exclude from Growth Chart --    No data found.  Updated Vital Signs BP 114/79 (BP Location: Right Arm)   Pulse 71   Temp 99 F (37.2 C) (Oral)   Resp 18   SpO2 97%   Visual Acuity Right Eye Distance:   Left Eye Distance:   Bilateral Distance:    Right Eye Near:   Left Eye Near:    Bilateral Near:     Physical Exam Constitutional:      Appearance: Normal appearance.  Eyes:     Extraocular Movements: Extraocular movements intact.  Pulmonary:     Effort: Pulmonary effort is normal.  Musculoskeletal:     Comments: Ecchymosis and swelling present to the base of the 3rd and 4th metacarpals, tenderness present to the 3rd, 4th and 5th base metacarpals, unable to flex but able to extend, sensation intact, 2+ radial pulse  Neurological:     Mental Status: She is alert and oriented to person, place, and time. Mental status is at baseline.      UC Treatments / Results  Labs (all labs ordered are listed, but only abnormal results are displayed) Labs Reviewed - No data to display  EKG   Radiology DG Hand Complete Right Result Date: 01/25/2024 CLINICAL DATA:  Injury. EXAM: RIGHT HAND - COMPLETE 3+ VIEW COMPARISON:  01/22/2017. FINDINGS: No acute fracture or dislocation. No aggressive osseous lesion. No significant arthritis of the imaged joints. No radiopaque foreign bodies. Soft tissues are within normal limits. IMPRESSION: No acute osseous abnormality of the right hand. Electronically Signed   By: Ree Molt M.D.   On: 01/25/2024 14:08    Procedures Procedures (including critical care time)  Medications Ordered in UC Medications - No data to display  Initial Impression / Assessment and Plan / UC Course  I have reviewed the triage vital signs and the nursing notes.  Pertinent labs & imaging results that were available during my care of the patient were reviewed by me and  considered in my medical decision making (see chart for details).  Right hand pain  X-ray negative, reported to patient via telephone, 2 identifiers  used, recommended NSAIDs and RICE as injury just occurred and treatment has not been initiated, recommend orthopedic follow-up if no improvement Final Clinical Impressions(s) / UC Diagnoses   Final diagnoses:  Right hand pain     Discharge Instructions      Today you are evaluated for your hand pain  X-ray is pending and you will be notified of results via telephone  If there is a break in the bone then you may return to clinic for splinting which as stability and support to the break  Begin use of ibuprofen  taking 600 800 mg consistently to reduce inflammation and pain, may use Tylenol  additionally  May apply ice or heat over the affected area 10 to 15-minute intervals  Elevate on pillows whenever sitting and lying to help reduce swelling  If there is a break in the bone to follow-up with one of the orthopedic clinics listed on front page   ED Prescriptions   None    PDMP not reviewed this encounter.   Teresa Shelba SAUNDERS, NP 01/25/24 1415    Teresa Shelba SAUNDERS, NP 01/25/24 [phone]

## 2024-01-25 NOTE — ED Triage Notes (Signed)
 Patient reports that she punched wooden door several times with a closed fist. Patient now complains of pain and swelling to right hand. Rates pain 6/10. Patient hs not taken anything for symptoms.

## 2024-02-06 ENCOUNTER — Ambulatory Visit
Admission: EM | Admit: 2024-02-06 | Discharge: 2024-02-06 | Disposition: A | Discharge status: Home / Self Care | Attending: Emergency Medicine | Admitting: Emergency Medicine

## 2024-02-06 DIAGNOSIS — J029 Acute pharyngitis, unspecified: Secondary | ICD-10-CM | POA: Diagnosis not present

## 2024-02-06 LAB — POCT RAPID STREP A (OFFICE): Rapid Strep A Screen: NEGATIVE

## 2024-02-06 NOTE — ED Triage Notes (Signed)
 Patient to Urgent Care with complaints of sore throat/ swollen tonsils/ painful to swallow. Unsure of any fevers. States she feels under the weather.   Reports waking up with symptoms this morning around 4am.

## 2024-02-06 NOTE — Discharge Instructions (Addendum)
 Your strep test is negative.  Take Tylenol or ibuprofen as needed for discomfort.  Follow up with your primary care provider if your symptoms are not improving.

## 2024-02-06 NOTE — ED Provider Notes (Signed)
 [name]    CSN: [id number] Arrival date & time: 02/06/24  1819      History   Chief Complaint Chief Complaint  Patient presents with   Sore Throat    HPI Connie Johnson is a 23 y.o. female.  Patient presents with sore throat since this morning.  She reports a mild occasional cough.  No fever, rash, shortness of breath, vomiting, diarrhea.  No OTC medications taken.  The history is provided by the patient and medical records.    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   Migraine without aura and without status migrainosus, not intractable 10/16/2015   Episodic tension-type headache, not intractable 10/16/2015   Intermittent explosive disorder 10/16/2015   Medication adverse effect 09/10/2015   Outbursts of anger 09/10/2015   Episodic memory loss 09/10/2015   Bipolar mood disorder (HCC) 05/21/2015   Attention deficit hyperactivity disorder (ADHD) 05/21/2015   GAD (generalized anxiety disorder) 05/21/2015   Adjustment disorder with other symptom 01/24/2014    Past Surgical History:  Procedure Laterality Date   WISDOM TOOTH EXTRACTION      OB History     Gravida  0   Para  0   Term  0   Preterm  0   AB  0   Living         SAB  0   IAB  0   Ectopic  0   Multiple      Live Births               Home Medications    Prior to Admission medications   Medication Sig Start Date End Date Taking? Authorizing Provider  albuterol  (VENTOLIN  HFA) 108 (90 Base) MCG/ACT inhaler Inhale 1-2  puffs into the lungs every 6 (six) hours as needed. 11/23/23   Corlis Burnard DEL, NP  etonogestrel  (NEXPLANON ) 68 MG IMPL implant Inject 68 mg into the skin once. 08/05/23   [provider]  fluticasone  (FLOVENT  HFA) 44 MCG/ACT inhaler Inhale 1 puff into the lungs 2 (two) times daily. 03/28/23   Antonette Angeline ORN, NP  metroNIDAZOLE  (FLAGYL ) 500 MG tablet Take 1 tablet (500 mg total) by mouth 2 (two) times daily. 07/21/23   Corlis Burnard DEL, NP  predniSONE  (DELTASONE ) 10 MG tablet Take 30 mg (3 tablets) every morning for 2 days, then take 20 mg (2 tablets) every morning for 2 days, then take 10 mg (1 tablet) every morning for 1 day Patient not taking: Reported on 07/21/2023 06/06/23   Teresa Shelba SAUNDERS, NP    Family History Family History  Problem Relation Age of Onset   Bipolar disorder Mother    Depression Mother    ADD / ADHD Brother    Bipolar disorder Maternal Grandmother    Depression Maternal Grandmother     Social History Social History   Tobacco Use   Smoking status: Never   Smokeless tobacco: Never  Vaping Use   Vaping status: Every Day  Substance Use Topics   Alcohol use:  Yes    Comment: social drinker   Drug use: Not Currently    Types: Marijuana    Comment: says she is around it.     Allergies   Dexedrine  [dextroamphetamine  sulfate er] and Dextroamphetamine  sulfate   Review of Systems Review of Systems  Constitutional:  Negative for chills and fever.  HENT:  Positive for sore throat. Negative for ear pain.   Respiratory:  Positive for cough. Negative for shortness of breath.      Physical Exam Triage Vital Signs ED Triage Vitals  Encounter Vitals Group     BP 02/06/24 1826 106/69     Girls Systolic BP Percentile --      Girls Diastolic BP Percentile --      Boys Systolic BP Percentile --      Boys Diastolic BP Percentile --      Pulse Rate 02/06/24 1826 74     Resp 02/06/24 1826 18     Temp 02/06/24 1826 98.3 F (36.8 C)     Temp Source 02/06/24 1826  Oral     SpO2 02/06/24 1826 98 %     Weight --      Height --      Head Circumference --      Peak Flow --      Pain Score 02/06/24 1825 7     Pain Loc --      Pain Education --      Exclude from Growth Chart --    No data found.  Updated Vital Signs BP 106/69   Pulse 74   Temp 98.3 F (36.8 C) (Oral)   Resp 18   SpO2 98%   Visual Acuity Right Eye Distance:   Left Eye Distance:   Bilateral Distance:    Right Eye Near:   Left Eye Near:    Bilateral Near:     Physical Exam Constitutional:      General: She is not in acute distress. HENT:     Right Ear: Tympanic membrane normal.     Left Ear: Tympanic membrane normal.     Nose: Nose normal.     Mouth/Throat:     Mouth: Mucous membranes are moist.     Pharynx: Posterior oropharyngeal erythema present.  Cardiovascular:     Rate and Rhythm: Normal rate and regular rhythm.     Heart sounds: Normal heart sounds.  Pulmonary:     Effort: Pulmonary effort is normal. No respiratory distress.     Breath sounds: Normal breath sounds.  Neurological:     Mental Status: She is alert.      UC Treatments / Results  Labs (all labs ordered are listed, but only abnormal results are displayed) Labs Reviewed  POCT RAPID STREP A (OFFICE)    EKG   Radiology No results found.  Procedures Procedures (including critical care time)  Medications Ordered in UC Medications - No data to display  Initial Impression / Assessment and Plan / UC Course  I have reviewed the triage vital signs and the nursing notes.  Pertinent labs & imaging results that were available during my care of the patient were reviewed by me and considered in my medical decision making (see chart for details).   Viral pharyngitis.  Rapid strep negative.  Afebrile and vital signs are stable.  Work note provided per patient request.  Tylenol  or ibuprofen  as needed.  Education provided on sore throat.  Instructed patient to follow up with her PCP if her  symptoms are not improving.  She agrees to plan of care.   Final Clinical Impressions(s) / UC Diagnoses   Final diagnoses:  Viral pharyngitis     Discharge Instructions      Your strep test is negative.  Take Tylenol  or ibuprofen  as needed for discomfort.  Follow-up with your primary care provider if your symptoms are not improving.      ED Prescriptions   None    PDMP not reviewed this encounter.   Corlis Burnard DEL, NP 02/06/24 [phone]

## 2024-02-09 ENCOUNTER — Encounter: Payer: Self-pay | Admitting: Emergency Medicine

## 2024-02-09 ENCOUNTER — Ambulatory Visit
Admission: EM | Admit: 2024-02-09 | Discharge: 2024-02-09 | Disposition: A | Discharge status: Home / Self Care | Attending: Emergency Medicine | Admitting: Emergency Medicine

## 2024-02-09 DIAGNOSIS — J039 Acute tonsillitis, unspecified: Secondary | ICD-10-CM | POA: Insufficient documentation

## 2024-02-09 DIAGNOSIS — R509 Fever, unspecified: Secondary | ICD-10-CM | POA: Diagnosis not present

## 2024-02-09 LAB — POC COVID19/FLU A&B COMBO
Covid Antigen, POC: NEGATIVE
Influenza A Antigen, POC: NEGATIVE
Influenza B Antigen, POC: NEGATIVE

## 2024-02-09 MED ORDER — ACETAMINOPHEN 325 MG PO TABS
975.0000 mg | ORAL_TABLET | Freq: Once | ORAL | Status: AC
  Administered 2024-02-09: 975 mg via ORAL

## 2024-02-09 MED ORDER — LIDOCAINE VISCOUS HCL 2 % MT SOLN: 15.0000 mL | OROMUCOSAL | 0 refills | Status: AC | PRN

## 2024-02-09 MED ORDER — AMOXICILLIN-POT CLAVULANATE 875-125 MG PO TABS: 1.0000 | ORAL_TABLET | Freq: Two times a day (BID) | ORAL | 0 refills | Status: AC

## 2024-02-09 NOTE — ED Provider Notes (Signed)
 [facility]    CSN: [id number] Arrival date & time: 02/09/24  1024      History   Chief Complaint Chief Complaint  Patient presents with   Fever   Sore Throat   Otalgia   Generalized Body Aches   Chills    HPI CODIE HAINER is a 23 y.o. female.   Patient presents for evaluation of fever peaking at 101.7, left-sided ear pain, sore throat and a productive cough present for 4 days.  Has had increased fatigue as well as a decreased appetite as she endorses her taste is altered.  Has been alternating ibuprofen  and Tylenol  with only minimal relief, feels as though symptoms are worsening as she has noted more Nazario Russom patches across the throat.  Was evaluated in this urgent care 3 days ago, rapid strep testing negative.    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   Migraine without aura and without status migrainosus, not intractable 10/16/2015   Episodic tension-type headache, not intractable 10/16/2015   Intermittent explosive disorder 10/16/2015   Medication adverse effect 09/10/2015   Outbursts of anger 09/10/2015   Episodic memory loss 09/10/2015   Bipolar mood disorder (HCC) 05/21/2015   Attention deficit hyperactivity disorder (ADHD) 05/21/2015   GAD (generalized anxiety disorder) 05/21/2015   Adjustment disorder with other symptom 01/24/2014    Past Surgical History:  Procedure Laterality Date   WISDOM TOOTH EXTRACTION      OB History     Gravida  0   Para  0   Term  0   Preterm  0   AB  0    Living         SAB  0   IAB  0   Ectopic  0   Multiple      Live Births               Home Medications    Prior to Admission medications   Medication Sig Start Date End Date Taking? Authorizing Provider  albuterol  (VENTOLIN  HFA) 108 (90 Base) MCG/ACT inhaler Inhale 1-2 puffs into the lungs every 6 (six) hours as needed. 11/23/23   Corlis Burnard DEL, NP  etonogestrel  (NEXPLANON ) 68 MG IMPL implant Inject 68 mg into the skin once. 08/05/23   [provider]  fluticasone  (FLOVENT  HFA) 44 MCG/ACT inhaler Inhale 1 puff into the lungs 2 (two) times daily. 03/28/23   Antonette Angeline ORN, NP  metroNIDAZOLE  (FLAGYL ) 500 MG tablet Take 1 tablet (500 mg total) by mouth 2 (two) times daily. 07/21/23   Corlis Burnard DEL, NP  predniSONE  (DELTASONE ) 10 MG tablet Take 30 mg (3 tablets) every morning for 2 days, then take 20 mg (2 tablets) every morning for 2 days, then take 10 mg (1 tablet) every morning for 1 day Patient not taking: Reported on 07/21/2023 06/06/23   Teresa Shelba SAUNDERS, NP    Family History Family History  Problem Relation Age of Onset   Bipolar disorder Mother    Depression Mother  ADD / ADHD Brother    Bipolar disorder Maternal Grandmother    Depression Maternal Grandmother     Social History Social History   Tobacco Use   Smoking status: Never   Smokeless tobacco: Never  Vaping Use   Vaping status: Every Day  Substance Use Topics   Alcohol use: Yes    Comment: social drinker   Drug use: Not Currently    Types: Marijuana    Comment: says she is around it.     Allergies   Dexedrine  [dextroamphetamine  sulfate er] and Dextroamphetamine  sulfate   Review of Systems Review of Systems  Constitutional:  Positive for fever. Negative for activity change, appetite change, chills, diaphoresis, fatigue and unexpected weight change.  HENT:  Positive for ear pain and sore throat. Negative for congestion, dental problem, drooling, ear discharge, facial swelling, hearing  loss, mouth sores, nosebleeds, postnasal drip, rhinorrhea, sinus pressure, sinus pain, sneezing, tinnitus, trouble swallowing and voice change.   Respiratory:  Positive for cough. Negative for apnea, choking, chest tightness, shortness of breath, wheezing and stridor.   Cardiovascular: Negative.   Gastrointestinal: Negative.      Physical Exam Triage Vital Signs ED Triage Vitals  Encounter Vitals Group     BP 02/09/24 1152 122/78     Girls Systolic BP Percentile --      Girls Diastolic BP Percentile --      Boys Systolic BP Percentile --      Boys Diastolic BP Percentile --      Pulse Rate 02/09/24 1152 (!) 125     Resp 02/09/24 1152 20     Temp 02/09/24 1152 (!) 102.1 F (38.9 C)     Temp Source 02/09/24 1152 Oral     SpO2 02/09/24 1152 98 %     Weight --      Height --      Head Circumference --      Peak Flow --      Pain Score 02/09/24 1154 10     Pain Loc --      Pain Education --      Exclude from Growth Chart --    No data found.  Updated Vital Signs BP 122/78 (BP Location: Left Arm)   Pulse (!) 125   Temp (!) 102.1 F (38.9 C) (Oral)   Resp 20   LMP 02/06/2024 (Exact Date)   SpO2 98%   Visual Acuity Right Eye Distance:   Left Eye Distance:   Bilateral Distance:    Right Eye Near:   Left Eye Near:    Bilateral Near:     Physical Exam Constitutional:      Appearance: Normal appearance. She is well-developed.  HENT:     Head: Normocephalic.     Right Ear: Tympanic membrane and ear canal normal.     Left Ear: Tympanic membrane and ear canal normal.     Nose: Congestion present.     Mouth/Throat:     Pharynx: Oropharyngeal exudate and posterior oropharyngeal erythema present.     Tonsils: Tonsillar exudate present. 2+ on the right. 3+ on the left.  Cardiovascular:     Rate and Rhythm: Normal rate and regular rhythm.     Heart sounds: Normal heart sounds.  Pulmonary:     Effort: Pulmonary effort is normal.     Breath sounds: Normal breath sounds.   Lymphadenopathy:     Cervical: Cervical adenopathy present.  Neurological:     Mental Status: She is alert.  UC Treatments / Results  Labs (all labs ordered are listed, but only abnormal results are displayed) Labs Reviewed  POC COVID19/FLU A&B COMBO    EKG   Radiology No results found.  Procedures Procedures (including critical care time)  Medications Ordered in UC Medications  acetaminophen  (TYLENOL ) tablet 975 mg (975 mg Oral Given 02/09/24 1207)    Initial Impression / Assessment and Plan / UC Course  I have reviewed the triage vital signs and the nursing notes.  Pertinent labs & imaging results that were available during my care of the patient were reviewed by me and considered in my medical decision making (see chart for details).  Acute tonsillitis, fever  Fever of 102.1 with associated tachycardia noted in triage, given Tylenol , COVID and flu testing negative, discussed findings, rapid strep test deferred as completed on 02/06/2024 at initial visit however due to appearance of throat culture is obtained, pending, significant erythema, tonsillar adenopathy and exudate, worse to the left side causes concern for bacteria therefore empirically placed on antibiotics, prescribed Augmentin  as well as viscous lidocaine, discussed management of fever and recommend increase fluid intake until able to tolerate fluids as normal, may use additional over-the-counter medication and supportive care with follow-up as Final Clinical Impressions(s) / UC Diagnoses   Final diagnoses:  Fever, unspecified fever cause   Discharge Instructions   None    ED Prescriptions   None    PDMP not reviewed this encounter.   Teresa Shelba SAUNDERS, NP 02/09/24 1254

## 2024-02-09 NOTE — Discharge Instructions (Signed)
 Today you will be started on oral antibiotics based on the appearance of your throat as there is redness swelling and Kaevion Sinclair patches  Take Augmentin  twice daily for 7 days  Rapid strep test on Monday was negative, will not repeat today but throat swab has been obtained and sent to the lab, pending for 3 days and you will be notified of any bacterial growth  May gargle and spit lidocaine solution every 4 hours to provide temporary relief to your throat  COVID and flu testing negative    You can take Tylenol  and/or Ibuprofen  as needed for fever reduction and pain relief.   For cough: honey 1/2 to 1 teaspoon (you can dilute the honey in water or another fluid).  You can also use guaifenesin and dextromethorphan for cough. You can use a humidifier for chest congestion and cough.  If you don't have a humidifier, you can sit in the bathroom with the hot shower running.      For sore throat: try warm salt water gargles, cepacol lozenges, throat spray, warm tea or water with lemon/honey, popsicles or ice, or OTC cold relief medicine for throat discomfort.   For congestion: take a daily anti-histamine like Zyrtec, Claritin, and a oral decongestant, such as pseudoephedrine.  You can also use Flonase  1-2 sprays in each nostril daily.   It is important to stay hydrated: drink plenty of fluids (water, gatorade/powerade/pedialyte, juices, or teas) to keep your throat moisturized and help further relieve irritation/discomfort.

## 2024-02-09 NOTE — ED Triage Notes (Signed)
 Patient reports sore throat, fever, body aches, chills and left ear pain x 4 days. Patient has been taking Ibuprofen  and Advil  no relief. Rates sore throat pain 10/10 and body aches 5/10.

## 2024-02-11 LAB — CULTURE, GROUP A STREP (THRC)

## 2024-02-13 ENCOUNTER — Ambulatory Visit (HOSPITAL_COMMUNITY): Payer: Self-pay

## 2024-03-07 DIAGNOSIS — R0981 Nasal congestion: Secondary | ICD-10-CM | POA: Diagnosis not present

## 2024-03-07 DIAGNOSIS — M791 Myalgia, unspecified site: Secondary | ICD-10-CM | POA: Diagnosis not present

## 2024-03-07 DIAGNOSIS — J101 Influenza due to other identified influenza virus with other respiratory manifestations: Secondary | ICD-10-CM | POA: Diagnosis not present

## 2024-03-07 DIAGNOSIS — R110 Nausea: Secondary | ICD-10-CM | POA: Diagnosis not present

## 2024-03-07 DIAGNOSIS — R059 Cough, unspecified: Secondary | ICD-10-CM | POA: Diagnosis not present

## 2024-03-12 DIAGNOSIS — R300 Dysuria: Secondary | ICD-10-CM | POA: Diagnosis not present

## 2024-03-12 DIAGNOSIS — Z3202 Encounter for pregnancy test, result negative: Secondary | ICD-10-CM | POA: Diagnosis not present

## 2024-03-12 DIAGNOSIS — N3001 Acute cystitis with hematuria: Secondary | ICD-10-CM | POA: Diagnosis not present

## 2024-03-12 DIAGNOSIS — R350 Frequency of micturition: Secondary | ICD-10-CM | POA: Diagnosis not present
